# Patient Record
Sex: Female | Born: 1945 | Race: White | Hispanic: No | Marital: Married | Smoking: Current some day smoker
Health system: Southern US, Community
[De-identification: ages and names within clinical notes are randomized; demographics above are authoritative.]

## PROBLEM LIST (undated history)

## (undated) DIAGNOSIS — R4020 Unspecified coma: Secondary | ICD-10-CM

## (undated) DIAGNOSIS — K859 Acute pancreatitis without necrosis or infection, unspecified: Secondary | ICD-10-CM

## (undated) DIAGNOSIS — H02409 Unspecified ptosis of unspecified eyelid: Secondary | ICD-10-CM

## (undated) DIAGNOSIS — I499 Cardiac arrhythmia, unspecified: Secondary | ICD-10-CM

## (undated) DIAGNOSIS — K5900 Constipation, unspecified: Secondary | ICD-10-CM

## (undated) DIAGNOSIS — D649 Anemia, unspecified: Secondary | ICD-10-CM

## (undated) DIAGNOSIS — E119 Type 2 diabetes mellitus without complications: Secondary | ICD-10-CM

## (undated) DIAGNOSIS — E041 Nontoxic single thyroid nodule: Secondary | ICD-10-CM

## (undated) DIAGNOSIS — I10 Essential (primary) hypertension: Secondary | ICD-10-CM

## (undated) DIAGNOSIS — M1990 Unspecified osteoarthritis, unspecified site: Secondary | ICD-10-CM

## (undated) DIAGNOSIS — E785 Hyperlipidemia, unspecified: Secondary | ICD-10-CM

## (undated) HISTORY — DX: Essential (primary) hypertension: I10

## (undated) HISTORY — PX: CORNEAL TRANSPLANT: SHX108

## (undated) HISTORY — DX: Type 2 diabetes mellitus without complications: E11.9

## (undated) HISTORY — DX: Unspecified coma: R40.20

---
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
## 1971-07-03 HISTORY — PX: OTHER SURGICAL HISTORY: SHX169

## 1987-07-03 HISTORY — PX: BREAST IMPLANT REMOVAL: SHX5361

## 2004-07-02 HISTORY — PX: CATARACT EXTRACTION: SUR2

## 2006-12-11 DIAGNOSIS — D649 Anemia, unspecified: Secondary | ICD-10-CM | POA: Insufficient documentation

## 2006-12-12 DIAGNOSIS — M810 Age-related osteoporosis without current pathological fracture: Secondary | ICD-10-CM | POA: Insufficient documentation

## 2007-06-03 ENCOUNTER — Ambulatory Visit (HOSPITAL_COMMUNITY): Admission: RE | Admit: 2007-06-03 | Discharge: 2007-06-03 | Payer: Self-pay | Admitting: *Deleted

## 2007-06-03 HISTORY — PX: CAROTID ANGIOGRAM: SHX5765

## 2007-06-24 ENCOUNTER — Encounter: Admission: RE | Admit: 2007-06-24 | Discharge: 2007-06-24 | Payer: Self-pay | Admitting: General Surgery

## 2007-06-24 ENCOUNTER — Other Ambulatory Visit: Admission: RE | Admit: 2007-06-24 | Discharge: 2007-06-24 | Payer: Self-pay | Admitting: Interventional Radiology

## 2007-06-24 ENCOUNTER — Encounter (INDEPENDENT_AMBULATORY_CARE_PROVIDER_SITE_OTHER): Payer: Self-pay | Admitting: Interventional Radiology

## 2007-07-03 HISTORY — PX: PARS PLANA VITRECTOMY W/ REPAIR OF MACULAR HOLE: SHX2170

## 2008-06-09 ENCOUNTER — Encounter: Admission: RE | Admit: 2008-06-09 | Discharge: 2008-06-09 | Payer: Self-pay | Admitting: General Surgery

## 2008-09-30 HISTORY — PX: CARDIOVASCULAR STRESS TEST: SHX262

## 2009-07-15 ENCOUNTER — Encounter: Admission: RE | Admit: 2009-07-15 | Discharge: 2009-07-15 | Payer: Self-pay | Admitting: General Surgery

## 2010-07-28 ENCOUNTER — Encounter
Admission: RE | Admit: 2010-07-28 | Discharge: 2010-07-28 | Payer: Self-pay | Source: Home / Self Care | Attending: General Surgery | Admitting: General Surgery

## 2011-01-25 ENCOUNTER — Other Ambulatory Visit: Payer: Self-pay | Admitting: Family Medicine

## 2011-01-25 DIAGNOSIS — R42 Dizziness and giddiness: Secondary | ICD-10-CM

## 2011-01-27 ENCOUNTER — Ambulatory Visit
Admission: RE | Admit: 2011-01-27 | Discharge: 2011-01-27 | Disposition: A | Discharge status: Home / Self Care | Payer: Medicare Other | Source: Ambulatory Visit | Attending: Family Medicine | Admitting: Family Medicine

## 2011-01-27 DIAGNOSIS — R42 Dizziness and giddiness: Secondary | ICD-10-CM

## 2011-01-27 MED ORDER — GADOBENATE DIMEGLUMINE 529 MG/ML IV SOLN
15.0000 mL | Freq: Once | INTRAVENOUS | Status: AC | PRN
  Administered 2011-01-27: 15 mL via INTRAVENOUS

## 2011-04-09 LAB — PROTIME-INR
INR: 0.9
Prothrombin Time: 12.4

## 2011-04-09 LAB — BASIC METABOLIC PANEL
BUN: 17
CO2: 21
Calcium: 10
Chloride: 107
Creatinine, Ser: 0.77
GFR calc Af Amer: >60
GFR calc non Af Amer: >60
Glucose, Bld: 113 — ABNORMAL HIGH
Potassium: 4.4
Sodium: 138

## 2011-04-09 LAB — CBC
HCT: 40.4
Hemoglobin: 13.9
MCHC: 34.5
MCV: 93.2
Platelets: 278
RBC: 4.34
RDW: 13.2
WBC: 8.3

## 2011-04-09 LAB — APTT: aPTT: 30

## 2011-04-27 DIAGNOSIS — T85398A Other mechanical complication of other ocular prosthetic devices, implants and grafts, initial encounter: Secondary | ICD-10-CM | POA: Insufficient documentation

## 2012-05-09 DIAGNOSIS — H26499 Other secondary cataract, unspecified eye: Secondary | ICD-10-CM | POA: Insufficient documentation

## 2012-05-16 DIAGNOSIS — K5901 Slow transit constipation: Secondary | ICD-10-CM | POA: Insufficient documentation

## 2013-07-23 ENCOUNTER — Encounter (HOSPITAL_COMMUNITY): Payer: Self-pay | Admitting: Emergency Medicine

## 2013-07-23 ENCOUNTER — Emergency Department (HOSPITAL_COMMUNITY): Payer: Medicare Other

## 2013-07-23 ENCOUNTER — Observation Stay (HOSPITAL_COMMUNITY)
Admission: EM | Admit: 2013-07-23 | Discharge: 2013-07-25 | Disposition: A | Discharge status: Home / Self Care | Payer: Medicare Other | Attending: Internal Medicine | Admitting: Internal Medicine

## 2013-07-23 DIAGNOSIS — E785 Hyperlipidemia, unspecified: Secondary | ICD-10-CM | POA: Insufficient documentation

## 2013-07-23 DIAGNOSIS — E7800 Pure hypercholesterolemia, unspecified: Secondary | ICD-10-CM | POA: Insufficient documentation

## 2013-07-23 DIAGNOSIS — F17200 Nicotine dependence, unspecified, uncomplicated: Secondary | ICD-10-CM | POA: Insufficient documentation

## 2013-07-23 DIAGNOSIS — S32009A Unspecified fracture of unspecified lumbar vertebra, initial encounter for closed fracture: Secondary | ICD-10-CM | POA: Insufficient documentation

## 2013-07-23 DIAGNOSIS — S32019A Unspecified fracture of first lumbar vertebra, initial encounter for closed fracture: Secondary | ICD-10-CM

## 2013-07-23 DIAGNOSIS — S32010A Wedge compression fracture of first lumbar vertebra, initial encounter for closed fracture: Secondary | ICD-10-CM

## 2013-07-23 DIAGNOSIS — M5030 Other cervical disc degeneration, unspecified cervical region: Secondary | ICD-10-CM | POA: Insufficient documentation

## 2013-07-23 DIAGNOSIS — E119 Type 2 diabetes mellitus without complications: Secondary | ICD-10-CM | POA: Diagnosis present

## 2013-07-23 DIAGNOSIS — R55 Syncope and collapse: Principal | ICD-10-CM | POA: Diagnosis present

## 2013-07-23 DIAGNOSIS — R002 Palpitations: Secondary | ICD-10-CM | POA: Diagnosis present

## 2013-07-23 HISTORY — DX: Hyperlipidemia, unspecified: E78.5

## 2013-07-23 HISTORY — DX: Type 2 diabetes mellitus without complications: E11.9

## 2013-07-23 LAB — CBC WITH DIFFERENTIAL/PLATELET
Basophils Absolute: 0 10*3/uL (ref 0.0–0.1)
Basophils Relative: 0 % (ref 0–1)
Eosinophils Absolute: 0.1 10*3/uL (ref 0.0–0.7)
Eosinophils Relative: 1 % (ref 0–5)
HCT: 38.3 % (ref 36.0–46.0)
Hemoglobin: 12.8 g/dL (ref 12.0–15.0)
Lymphocytes Relative: 15 % (ref 12–46)
Lymphs Abs: 1.3 10*3/uL (ref 0.7–4.0)
MCH: 31.9 pg (ref 26.0–34.0)
MCHC: 33.4 g/dL (ref 30.0–36.0)
MCV: 95.5 fL (ref 78.0–100.0)
Monocytes Absolute: 0.8 10*3/uL (ref 0.1–1.0)
Monocytes Relative: 10 % (ref 3–12)
Neutro Abs: 6.3 10*3/uL (ref 1.7–7.7)
Neutrophils Relative %: 74 % (ref 43–77)
Platelets: 180 10*3/uL (ref 150–400)
RBC: 4.01 MIL/uL (ref 3.87–5.11)
RDW: 13 % (ref 11.5–15.5)
WBC: 8.5 10*3/uL (ref 4.0–10.5)

## 2013-07-23 LAB — COMPREHENSIVE METABOLIC PANEL
ALT: 17 U/L (ref 0–35)
AST: 29 U/L (ref 0–37)
Albumin: 4 g/dL (ref 3.5–5.2)
Alkaline Phosphatase: 75 U/L (ref 39–117)
BUN: 20 mg/dL (ref 6–23)
CO2: 22 mEq/L (ref 19–32)
Calcium: 9.3 mg/dL (ref 8.4–10.5)
Chloride: 98 mEq/L (ref 96–112)
Creatinine, Ser: 0.83 mg/dL (ref 0.50–1.10)
GFR calc Af Amer: 82 mL/min — ABNORMAL LOW (ref >90)
GFR calc non Af Amer: 71 mL/min — ABNORMAL LOW (ref >90)
Glucose, Bld: 130 mg/dL — ABNORMAL HIGH (ref 70–99)
Potassium: 4.4 mEq/L (ref 3.7–5.3)
Sodium: 135 mEq/L — ABNORMAL LOW (ref 137–147)
Total Bilirubin: 0.2 mg/dL — ABNORMAL LOW (ref 0.3–1.2)
Total Protein: 7.4 g/dL (ref 6.0–8.3)

## 2013-07-23 LAB — POCT I-STAT TROPONIN I: Troponin i, poc: 0 ng/mL (ref 0.00–0.08)

## 2013-07-23 LAB — GLUCOSE, CAPILLARY: Glucose-Capillary: 126 mg/dL — ABNORMAL HIGH (ref 70–99)

## 2013-07-23 MED ORDER — MORPHINE SULFATE 4 MG/ML IJ SOLN
4.0000 mg | Freq: Once | INTRAMUSCULAR | Status: AC
  Administered 2013-07-23: 4 mg via INTRAVENOUS
  Filled 2013-07-23: qty 1

## 2013-07-23 MED ORDER — DIAZEPAM 5 MG/ML IJ SOLN: 5.0000 mg | Freq: Once | INTRAMUSCULAR | Status: DC

## 2013-07-23 MED ORDER — SODIUM CHLORIDE 0.9 % IV BOLUS (SEPSIS)
1000.0000 mL | Freq: Once | INTRAVENOUS | Status: AC
  Administered 2013-07-23: 1000 mL via INTRAVENOUS
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
## 2013-07-23 NOTE — ED Notes (Signed)
Pt ambulated to the restroom with ease. 
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
## 2013-07-24 ENCOUNTER — Encounter (HOSPITAL_COMMUNITY): Payer: Self-pay | Admitting: *Deleted

## 2013-07-24 ENCOUNTER — Observation Stay (HOSPITAL_COMMUNITY): Payer: Medicare Other

## 2013-07-24 DIAGNOSIS — R002 Palpitations: Secondary | ICD-10-CM | POA: Diagnosis present

## 2013-07-24 DIAGNOSIS — S32009A Unspecified fracture of unspecified lumbar vertebra, initial encounter for closed fracture: Secondary | ICD-10-CM

## 2013-07-24 DIAGNOSIS — R55 Syncope and collapse: Secondary | ICD-10-CM

## 2013-07-24 DIAGNOSIS — I517 Cardiomegaly: Secondary | ICD-10-CM

## 2013-07-24 DIAGNOSIS — E119 Type 2 diabetes mellitus without complications: Secondary | ICD-10-CM

## 2013-07-24 DIAGNOSIS — S32010A Wedge compression fracture of first lumbar vertebra, initial encounter for closed fracture: Secondary | ICD-10-CM | POA: Diagnosis present

## 2013-07-24 LAB — CBC
HCT: 34.1 % — ABNORMAL LOW (ref 36.0–46.0)
Hemoglobin: 11.6 g/dL — ABNORMAL LOW (ref 12.0–15.0)
MCH: 32.5 pg (ref 26.0–34.0)
MCHC: 34 g/dL (ref 30.0–36.0)
MCV: 95.5 fL (ref 78.0–100.0)
Platelets: 185 10*3/uL (ref 150–400)
RBC: 3.57 MIL/uL — ABNORMAL LOW (ref 3.87–5.11)
RDW: 12.9 % (ref 11.5–15.5)
WBC: 7.6 10*3/uL (ref 4.0–10.5)

## 2013-07-24 LAB — COMPREHENSIVE METABOLIC PANEL
ALT: 15 U/L (ref 0–35)
AST: 26 U/L (ref 0–37)
Albumin: 3.3 g/dL — ABNORMAL LOW (ref 3.5–5.2)
Alkaline Phosphatase: 67 U/L (ref 39–117)
BUN: 16 mg/dL (ref 6–23)
CO2: 21 mEq/L (ref 19–32)
Calcium: 8.9 mg/dL (ref 8.4–10.5)
Chloride: 99 mEq/L (ref 96–112)
Creatinine, Ser: 0.66 mg/dL (ref 0.50–1.10)
GFR calc Af Amer: >90 mL/min (ref >90)
GFR calc non Af Amer: 89 mL/min — ABNORMAL LOW (ref >90)
Glucose, Bld: 166 mg/dL — ABNORMAL HIGH (ref 70–99)
Potassium: 4.4 mEq/L (ref 3.7–5.3)
Sodium: 136 mEq/L — ABNORMAL LOW (ref 137–147)
Total Bilirubin: <0.2 mg/dL — ABNORMAL LOW (ref 0.3–1.2)
Total Protein: 6.6 g/dL (ref 6.0–8.3)

## 2013-07-24 LAB — GLUCOSE, CAPILLARY
Glucose-Capillary: 112 mg/dL — ABNORMAL HIGH (ref 70–99)
Glucose-Capillary: 137 mg/dL — ABNORMAL HIGH (ref 70–99)
Glucose-Capillary: 122 mg/dL — ABNORMAL HIGH (ref 70–99)

## 2013-07-24 LAB — PROTIME-INR
INR: 0.98 (ref 0.00–1.49)
Prothrombin Time: 12.8 seconds (ref 11.6–15.2)

## 2013-07-24 LAB — TROPONIN I
Troponin I: <0.3 ng/mL (ref <0.30)
Troponin I: <0.3 ng/mL (ref <0.30)
Troponin I: <0.3 ng/mL (ref <0.30)

## 2013-07-24 LAB — HEMOGLOBIN A1C
Hgb A1c MFr Bld: 6.1 % — ABNORMAL HIGH (ref <5.7)
Mean Plasma Glucose: 128 mg/dL — ABNORMAL HIGH (ref <117)

## 2013-07-24 MED ORDER — ENOXAPARIN SODIUM 40 MG/0.4ML ~~LOC~~ SOLN
40.0000 mg | SUBCUTANEOUS | Status: DC
  Administered 2013-07-24: 40 mg via SUBCUTANEOUS
  Filled 2013-07-24 (×2): qty 0.4

## 2013-07-24 MED ORDER — CALCIUM-VITAMIN D 600-125 MG-UNIT PO TABS: 1.0000 | ORAL_TABLET | Freq: Every day | ORAL | Status: DC

## 2013-07-24 MED ORDER — INSULIN ASPART 100 UNIT/ML ~~LOC~~ SOLN: 0.0000 [IU] | Freq: Three times a day (TID) | SUBCUTANEOUS | Status: DC

## 2013-07-24 MED ORDER — ACETAMINOPHEN 650 MG RE SUPP: 650.0000 mg | Freq: Four times a day (QID) | RECTAL | Status: DC | PRN

## 2013-07-24 MED ORDER — CYCLOBENZAPRINE HCL 5 MG PO TABS
7.5000 mg | ORAL_TABLET | Freq: Three times a day (TID) | ORAL | Status: DC | PRN
  Filled 2013-07-24: qty 1.5

## 2013-07-24 MED ORDER — CALCIUM CARBONATE-VITAMIN D 500-200 MG-UNIT PO TABS
1.0000 | ORAL_TABLET | Freq: Every day | ORAL | Status: DC
  Administered 2013-07-24 – 2013-07-25 (×2): 1 via ORAL
  Filled 2013-07-24 (×3): qty 1

## 2013-07-24 MED ORDER — ONDANSETRON HCL 4 MG/2ML IJ SOLN
4.0000 mg | Freq: Four times a day (QID) | INTRAMUSCULAR | Status: DC | PRN
  Administered 2013-07-24: 4 mg via INTRAVENOUS
  Filled 2013-07-24: qty 2

## 2013-07-24 MED ORDER — HYDROCODONE-ACETAMINOPHEN 5-325 MG PO TABS
1.0000 | ORAL_TABLET | ORAL | Status: DC | PRN
  Administered 2013-07-24 – 2013-07-25 (×4): 2 via ORAL
  Filled 2013-07-24 (×4): qty 2

## 2013-07-24 MED ORDER — DOCUSATE SODIUM 100 MG PO CAPS
200.0000 mg | ORAL_CAPSULE | Freq: Two times a day (BID) | ORAL | Status: DC
  Administered 2013-07-24 (×2): 200 mg via ORAL
  Filled 2013-07-24 (×4): qty 2

## 2013-07-24 MED ORDER — SODIUM CHLORIDE 0.9 % IV SOLN
INTRAVENOUS | Status: AC
  Administered 2013-07-24: 05:00:00 via INTRAVENOUS

## 2013-07-24 MED ORDER — ACETAMINOPHEN 325 MG PO TABS: 650.0000 mg | ORAL_TABLET | Freq: Four times a day (QID) | ORAL | Status: DC | PRN

## 2013-07-24 MED ORDER — METOPROLOL TARTRATE 25 MG PO TABS
25.0000 mg | ORAL_TABLET | Freq: Two times a day (BID) | ORAL | Status: DC
  Administered 2013-07-24: 25 mg via ORAL
  Filled 2013-07-24 (×4): qty 1

## 2013-07-24 MED ORDER — SODIUM CHLORIDE 0.9 % IJ SOLN
3.0000 mL | Freq: Two times a day (BID) | INTRAMUSCULAR | Status: DC
  Administered 2013-07-24: 05:00:00 via INTRAVENOUS
  Administered 2013-07-24: 3 mL via INTRAVENOUS

## 2013-07-24 MED ORDER — ONDANSETRON HCL 4 MG PO TABS: 4.0000 mg | ORAL_TABLET | Freq: Four times a day (QID) | ORAL | Status: DC | PRN

## 2013-07-24 MED ORDER — METFORMIN HCL ER 500 MG PO TB24
500.0000 mg | ORAL_TABLET | Freq: Every day | ORAL | Status: DC
  Administered 2013-07-24 – 2013-07-25 (×2): 500 mg via ORAL
  Filled 2013-07-24 (×3): qty 1

## 2013-07-24 MED ORDER — GUAIFENESIN ER 600 MG PO TB12
600.0000 mg | ORAL_TABLET | Freq: Two times a day (BID) | ORAL | Status: DC
  Administered 2013-07-24 (×2): 600 mg via ORAL
  Filled 2013-07-24 (×4): qty 1

## 2013-07-24 NOTE — Progress Notes (Signed)
EEG completed; results pending.    
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
## 2013-07-24 NOTE — Procedures (Signed)
History: 68 year old female with a history of syncope  Sedation: None  Background: The background consists of alpha and beta activities. There is a well defined posterior dominant rhythm of 8.5 Hz that attenuates with eye opening. There is mild anterior shifting of the PDR with drowsiness. No sleep structures were observed  Photic stimulation: Physiologic driving is present  EEG Abnormalities: 1) none  Clinical Interpretation: This normal EEG is recorded in the waking and sleep state. There was no seizure or seizure predisposition recorded on this study.   Ritta SlotMcNeill Kirkpatrick, MD [facility] [phone]  If 7pm- 7am, please page neurology on call at [phone].

## 2013-07-24 NOTE — Progress Notes (Signed)
Bilateral carotid artery duplex:  1-39% ICA stenosis.  Vertebral artery flow is antegrade.     

## 2013-07-24 NOTE — Progress Notes (Signed)
Pt examined and chart reviewed. [facility]. Pain is significantly improved after receiving norco. She is awake, alert, and oriented and appears comfortable. She will be monitored on telemetry to check for arrhythmia that may have lead to her possible syncope episode. Pt is a diabetic on metformin, will order BID CBGs. All of her questions were addressed.
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
## 2013-07-24 NOTE — Progress Notes (Signed)
  Echocardiogram 2D Echocardiogram has been performed.  Jorje GuildCHUNG, KWONG 07/24/2013, 12:10 PM

## 2013-07-24 NOTE — Progress Notes (Signed)
UR completed 

## 2013-07-25 DIAGNOSIS — R002 Palpitations: Secondary | ICD-10-CM

## 2013-07-25 LAB — GLUCOSE, CAPILLARY
Glucose-Capillary: 130 mg/dL — ABNORMAL HIGH (ref 70–99)
Glucose-Capillary: 131 mg/dL — ABNORMAL HIGH (ref 70–99)

## 2013-07-25 MED ORDER — IBUPROFEN 400 MG PO TABS: 200.0000 mg | ORAL_TABLET | Freq: Four times a day (QID) | ORAL | Status: DC | PRN

## 2013-07-25 MED ORDER — HYDROCODONE-ACETAMINOPHEN 5-325 MG PO TABS: 1.0000 | ORAL_TABLET | Freq: Four times a day (QID) | ORAL | Status: DC | PRN
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
## 2013-07-25 NOTE — Progress Notes (Signed)
Went over discharge instructions with patient and husband. Again stressed importance of patient not to drive until cleared by MD. No additional questions or concerns related to discharge. IV discontinued, and patient taken off cardiac monitor. Patient discharged home with husband. Stanton KidneyCURRY, TEAL R
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
## 2013-07-25 NOTE — Discharge Instructions (Signed)
Do not drive and provide baby sitting services until you have seen by the cardiologist and the  Neurologist and advised to do so again.  Where the lumbar brace given to you while you were ambulating and standing.   Follow with Primary MD SPEAR, TAMMY, MD in 7 days   Get CBC, CMP, checked 7 days by Primary MD and again as instructed by your Primary MD.    Activity: As tolerated with Full fall precautions use walker/cane & assistance as needed   Disposition Home     Diet:  Heart Healthy - Low carb  For Heart failure patients - Check your Weight same time everyday, if you gain over 2 pounds, or you develop in leg swelling, experience more shortness of breath or chest pain, call your Primary MD immediately. Follow Cardiac Low Salt Diet and 1.8 lit/day fluid restriction.   On your next visit with her primary care physician please Get Medicines reviewed and adjusted.  Please request your Prim.MD to go over all Hospital Tests and Procedure/Radiological results at the follow up, please get all Hospital records sent to your Prim MD by signing hospital release before you go home.   If you experience worsening of your admission symptoms, develop shortness of breath, life threatening emergency, suicidal or homicidal thoughts you must seek medical attention immediately by calling 911 or calling your MD immediately  if symptoms less severe.  You Must read complete instructions/literature along with all the possible adverse reactions/side effects for all the Medicines you take and that have been prescribed to you. Take any new Medicines after you have completely understood and accpet all the possible adverse reactions/side effects.   Do not drive when taking Pain medications.    Do not take more than prescribed Pain, Sleep and Anxiety Medications  Special Instructions: If you have smoked or chewed Tobacco  in the last 2 yrs please stop smoking, stop any regular Alcohol  and or any Recreational  drug use.  Wear Seat belts while driving.   Please note  You were cared for by a hospitalist during your hospital stay. If you have any questions about your discharge medications or the care you received while you were in the hospital after you are discharged, you can call the unit and asked to speak with the hospitalist on call if the hospitalist that took care of you is not available. Once you are discharged, your primary care physician will handle any further medical issues. Please note that NO REFILLS for any discharge medications will be authorized once you are discharged, as it is imperative that you return to your primary care physician (or establish a relationship with a primary care physician if you do not have one) for your aftercare needs so that they can reassess your need for medications and monitor your lab values.

## 2013-07-25 NOTE — Progress Notes (Signed)
Patient has strict orders from MD not to operate a vehicle for 1 month. I explained the order to the patient and the reasoning behind it. Patient confirmed understanding, and agreed to comply with this order. Patient stated that her Husband will be her primary source of transportation. Stanton KidneyCURRY, TEAL R

## 2013-07-29 ENCOUNTER — Ambulatory Visit (INDEPENDENT_AMBULATORY_CARE_PROVIDER_SITE_OTHER): Payer: Medicare Other | Admitting: Diagnostic Neuroimaging

## 2013-07-29 ENCOUNTER — Encounter: Payer: Self-pay | Admitting: Diagnostic Neuroimaging

## 2013-07-29 ENCOUNTER — Ambulatory Visit: Payer: Medicare Other | Admitting: *Deleted

## 2013-07-29 VITALS — BP 121/72 | HR 68 | Ht 64.0 in | Wt 161.0 lb

## 2013-07-29 DIAGNOSIS — R55 Syncope and collapse: Secondary | ICD-10-CM

## 2013-07-29 DIAGNOSIS — H02409 Unspecified ptosis of unspecified eyelid: Secondary | ICD-10-CM

## 2013-07-29 DIAGNOSIS — H02401 Unspecified ptosis of right eyelid: Secondary | ICD-10-CM | POA: Insufficient documentation
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
## 2013-07-30 DIAGNOSIS — R55 Syncope and collapse: Secondary | ICD-10-CM

## 2013-08-10 ENCOUNTER — Ambulatory Visit
Admission: RE | Admit: 2013-08-10 | Discharge: 2013-08-10 | Disposition: A | Discharge status: Home / Self Care | Payer: Medicare Other | Source: Ambulatory Visit | Attending: Diagnostic Neuroimaging | Admitting: Diagnostic Neuroimaging

## 2013-08-10 DIAGNOSIS — H02409 Unspecified ptosis of unspecified eyelid: Secondary | ICD-10-CM

## 2013-08-10 DIAGNOSIS — R55 Syncope and collapse: Secondary | ICD-10-CM

## 2013-08-10 DIAGNOSIS — H02401 Unspecified ptosis of right eyelid: Secondary | ICD-10-CM

## 2013-08-13 ENCOUNTER — Telehealth: Payer: Self-pay | Admitting: Diagnostic Neuroimaging

## 2013-08-14 NOTE — Telephone Encounter (Signed)
Calling again regarding MRI results and also needs note to go back to work Monday

## 2013-08-14 NOTE — Telephone Encounter (Signed)
I spoke with pt and gave her the MRI results.   I relayed to her after speaking with Dr. Marjory LiesPenumalli again that he has cleared her from seizure standpoint.  He feels that pcp or cardiology should give her clearance for work since she is still being worked up from their standpoint.  She verbalized understanding.

## 2013-08-14 NOTE — Telephone Encounter (Signed)
I spoke to pt and she is feeling ok.  She has cardiac monitor on for 2 wks now.  Has appt with CD on 09-08-13.  Husband is driving her.  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  Please advise and I will call cell # back.

## 2013-08-31 ENCOUNTER — Encounter: Payer: Self-pay | Admitting: Cardiovascular Disease

## 2013-08-31 ENCOUNTER — Ambulatory Visit (INDEPENDENT_AMBULATORY_CARE_PROVIDER_SITE_OTHER): Payer: Medicare Other | Admitting: Cardiovascular Disease

## 2013-08-31 VITALS — BP 128/68 | HR 69 | Ht 64.0 in | Wt 156.0 lb

## 2013-08-31 DIAGNOSIS — R002 Palpitations: Secondary | ICD-10-CM

## 2013-08-31 DIAGNOSIS — E785 Hyperlipidemia, unspecified: Secondary | ICD-10-CM | POA: Insufficient documentation

## 2013-08-31 DIAGNOSIS — R55 Syncope and collapse: Secondary | ICD-10-CM

## 2013-08-31 MED ORDER — METOPROLOL TARTRATE 25 MG PO TABS: 25.0000 mg | ORAL_TABLET | Freq: Two times a day (BID) | ORAL | Status: DC
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
## 2013-08-31 NOTE — Assessment & Plan Note (Signed)
Statin intolerant, on red yeast rice followed by her PCP

## 2013-08-31 NOTE — Patient Instructions (Signed)
Your physician wants you to follow-up in: 1 year with Dr Berry. You will receive a reminder letter in the mail two months in advance. If you don't receive a letter, please call our office to schedule the follow-up appointment.  

## 2013-09-08 ENCOUNTER — Ambulatory Visit: Payer: Medicare Other | Admitting: Cardiovascular Disease

## 2014-06-02 DIAGNOSIS — Z947 Corneal transplant status: Secondary | ICD-10-CM | POA: Insufficient documentation

## 2014-06-22 ENCOUNTER — Encounter: Payer: Self-pay | Admitting: Cardiovascular Disease

## 2014-09-03 ENCOUNTER — Encounter: Payer: Self-pay | Admitting: Cardiovascular Disease

## 2014-09-03 ENCOUNTER — Ambulatory Visit (INDEPENDENT_AMBULATORY_CARE_PROVIDER_SITE_OTHER): Payer: Medicare Other | Admitting: Cardiovascular Disease

## 2014-09-03 VITALS — BP 122/60 | HR 64 | Ht 64.0 in | Wt 156.3 lb

## 2014-09-03 DIAGNOSIS — R002 Palpitations: Secondary | ICD-10-CM

## 2014-09-03 DIAGNOSIS — E785 Hyperlipidemia, unspecified: Secondary | ICD-10-CM

## 2014-09-03 NOTE — Assessment & Plan Note (Signed)
History of hyperlipidemia with statin intolerance. She is on red yeast rice and low-dose atorvastatin followed by her PCP
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
## 2014-09-03 NOTE — Patient Instructions (Signed)
Dr.Berry wants you to follow-up in: ONE YEAR. You will receive a reminder letter in the mail two months in advance. If you don't receive a letter, please call our office to schedule the follow-up appointment.  

## 2014-10-21 ENCOUNTER — Telehealth: Payer: Self-pay | Admitting: *Deleted

## 2014-10-21 NOTE — Telephone Encounter (Signed)
Patient wrote Dr Allyson SabalBerry a letter concerned about her muscle discomfort coming from her statin possibly.  Dr Allyson SabalBerry reviewed the letter and suggested that she see Belenda CruiseKristin in the lipid clinic.   Patient contacted and appt made.

## 2014-10-22 ENCOUNTER — Telehealth: Payer: Self-pay | Admitting: Cardiovascular Disease

## 2014-10-22 ENCOUNTER — Ambulatory Visit (INDEPENDENT_AMBULATORY_CARE_PROVIDER_SITE_OTHER): Payer: Medicare Other | Admitting: Pharmacist Clinician (PhC)/ Clinical Pharmacy Specialist

## 2014-10-22 VITALS — Ht 64.0 in | Wt 156.0 lb

## 2014-10-22 DIAGNOSIS — E785 Hyperlipidemia, unspecified: Secondary | ICD-10-CM | POA: Diagnosis not present

## 2014-10-22 NOTE — Telephone Encounter (Signed)
°  1. Which medications need to be refilled? Lov[facility]  2. Which pharmacy is medication to be sent to?[facility]   3. Do they need a 30 day or 90 day supply? 30  4. Would they like a call back once the medication has been sent to the pharmacy? yes

## 2014-10-22 NOTE — Telephone Encounter (Signed)
Pt presented to pharmacy to get fish oil. Rx for fish oil submitted over the phone per our pharmacist recommendations (see Kristin's note from today) to pharmacy.

## 2014-10-22 NOTE — Patient Instructions (Signed)
Stop atorvastatin.   Wait 2-3 weeks then start the Crestor 5 mg once weekly.  If you can tolerate this after 4 weeks then increase to 1 tablet twice weekly.   Call me when you run out and we'll try to get more samples.  Start fish oil, 2 capsules twice daily.  Can keep in refrigerator to avoid fishy taste.  Will repeat labs in 8-10 weeks.

## 2014-10-24 ENCOUNTER — Encounter: Payer: Self-pay | Admitting: Pharmacist Clinician (PhC)/ Clinical Pharmacy Specialist

## 2014-10-24 MED ORDER — ROSUVASTATIN CALCIUM 5 MG PO TABS: ORAL_TABLET | ORAL | Status: DC
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
## 2014-10-24 NOTE — Assessment & Plan Note (Addendum)
Patient will not qualify for PCSK-9 inhibitor due to lack of CAD at this time.  Patient understands this.  For now I have asked her to start with fish oil 2 gm bid to help lower her triglycerides.  She is also to stop the atorvastatin and red yeast rice.  After 2-3 weeks, as her muscle aches start to go away, she can re-challenge with Crestor 5 mg once weekly.  I told her that if she can tolerate this with little/no muscle problems we can then increase the dose to twice weekly.  She is to call in 4-6 weeks and let us know how she is doing.  Will repeat labs after she has been on the Crestor for 8-10 weeks.

## 2014-11-04 ENCOUNTER — Encounter: Payer: Self-pay | Admitting: Cardiovascular Disease

## 2014-11-05 ENCOUNTER — Encounter: Payer: Self-pay | Admitting: Cardiovascular Disease

## 2014-12-02 ENCOUNTER — Telehealth: Payer: Self-pay | Admitting: Pharmacist Clinician (PhC)/ Clinical Pharmacy Specialist

## 2014-12-02 NOTE — Telephone Encounter (Signed)
Pt called, LMOM, stating has been on Crestor 5 mg qweek without problem since April visit.  Recently tried to increase to 10 mg qweek, but developed muscle aches and pain in joints.    Returned call, spoke with husband, advised to stay with 5 mg but try twice weekly.  If she cannot tolerate this then go back to 5 mg qweek, and let us know.

## 2015-01-10 ENCOUNTER — Telehealth: Payer: Self-pay | Admitting: Pharmacist Clinician (PhC)/ Clinical Pharmacy Specialist

## 2015-01-10 DIAGNOSIS — E785 Hyperlipidemia, unspecified: Secondary | ICD-10-CM

## 2015-01-10 NOTE — Telephone Encounter (Signed)
Spoke with patient, has been taking Crestor 10 mg twice weekly since April and has recently developed some minor muscle aches/pains.    Will have patient get lipid labs drawn this week and depending on results, may be able to cut back dose to 5 mg 2-3 times each week.  Pt hesitant to decrease dose, but also afraid of pains becoming worse.  Agreed with plan to check labs (will try to do tomorrow) and wait for results before making changes.

## 2015-01-11 LAB — HEPATIC FUNCTION PANEL
ALT: 12 U/L (ref 0–35)
AST: 19 U/L (ref 0–37)
Albumin: 4.4 g/dL (ref 3.5–5.2)
Alkaline Phosphatase: 53 U/L (ref 39–117)
Bilirubin, Direct: 0.1 mg/dL (ref 0.0–0.3)
Indirect Bilirubin: 0.3 mg/dL (ref 0.2–1.2)
Total Bilirubin: 0.4 mg/dL (ref 0.2–1.2)
Total Protein: 6.8 g/dL (ref 6.0–8.3)

## 2015-01-11 LAB — LIPID PANEL
Cholesterol: 206 mg/dL — ABNORMAL HIGH (ref 0–200)
HDL: 59 mg/dL (ref >=46)
LDL Cholesterol: 114 mg/dL — ABNORMAL HIGH (ref 0–99)
Total CHOL/HDL Ratio: 3.5 Ratio
Triglycerides: 164 mg/dL — ABNORMAL HIGH (ref <150)
VLDL: 33 mg/dL (ref 0–40)

## 2015-01-14 ENCOUNTER — Encounter: Payer: Self-pay | Admitting: *Deleted

## 2015-01-17 ENCOUNTER — Telehealth: Payer: Self-pay | Admitting: Pharmacist Clinician (PhC)/ Clinical Pharmacy Specialist

## 2015-01-17 MED ORDER — ROSUVASTATIN CALCIUM 5 MG PO TABS: ORAL_TABLET | ORAL | Status: DC

## 2015-01-17 NOTE — Telephone Encounter (Signed)
Reviewed labs with patient.  LDL better at 114, on Crestor 10 mg twice weekly.  Pt states will probably stay at this dose, but may try 5 mg 3-4 times per week to see if any change.  Will call prescription into [facility] - [place]

## 2015-02-18 DIAGNOSIS — Z8639 Personal history of other endocrine, nutritional and metabolic disease: Secondary | ICD-10-CM | POA: Insufficient documentation

## 2015-08-10 DIAGNOSIS — H26493 Other secondary cataract, bilateral: Secondary | ICD-10-CM | POA: Insufficient documentation

## 2015-08-10 DIAGNOSIS — Z961 Presence of intraocular lens: Secondary | ICD-10-CM | POA: Insufficient documentation

## 2015-08-10 DIAGNOSIS — H43813 Vitreous degeneration, bilateral: Secondary | ICD-10-CM | POA: Insufficient documentation

## 2015-09-07 ENCOUNTER — Encounter: Payer: Self-pay | Admitting: Cardiovascular Disease

## 2015-09-07 ENCOUNTER — Ambulatory Visit (INDEPENDENT_AMBULATORY_CARE_PROVIDER_SITE_OTHER): Payer: Medicare Other | Admitting: Cardiovascular Disease

## 2015-09-07 VITALS — BP 132/78 | HR 61 | Ht 64.0 in | Wt 156.0 lb

## 2015-09-07 DIAGNOSIS — E785 Hyperlipidemia, unspecified: Secondary | ICD-10-CM

## 2015-09-07 DIAGNOSIS — R002 Palpitations: Secondary | ICD-10-CM
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
## 2015-09-07 NOTE — Patient Instructions (Signed)

## 2015-09-07 NOTE — Assessment & Plan Note (Signed)
History of hyperlipidemia statin intolerant on 5 mg of Crestor 3 times a week. Her most recent lipid profile performed by her PCP 07/27/15 revealed total cholesterol 229, LDL 129 and HDL of 69.
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
## 2016-07-18 ENCOUNTER — Emergency Department (HOSPITAL_COMMUNITY): Payer: Medicare Other

## 2016-07-18 ENCOUNTER — Inpatient Hospital Stay (HOSPITAL_COMMUNITY): Payer: Medicare Other

## 2016-07-18 ENCOUNTER — Inpatient Hospital Stay (HOSPITAL_COMMUNITY)
Admission: EM | Admit: 2016-07-18 | Discharge: 2016-07-22 | DRG: 439 | Disposition: A | Discharge status: Home / Self Care | Payer: Medicare Other | Attending: Internal Medicine | Admitting: Internal Medicine

## 2016-07-18 ENCOUNTER — Encounter (HOSPITAL_COMMUNITY): Payer: Self-pay | Admitting: Emergency Medicine

## 2016-07-18 DIAGNOSIS — E119 Type 2 diabetes mellitus without complications: Secondary | ICD-10-CM

## 2016-07-18 DIAGNOSIS — Z880 Allergy status to penicillin: Secondary | ICD-10-CM | POA: Diagnosis not present

## 2016-07-18 DIAGNOSIS — D72829 Elevated white blood cell count, unspecified: Secondary | ICD-10-CM

## 2016-07-18 DIAGNOSIS — E785 Hyperlipidemia, unspecified: Secondary | ICD-10-CM | POA: Diagnosis present

## 2016-07-18 DIAGNOSIS — I10 Essential (primary) hypertension: Secondary | ICD-10-CM

## 2016-07-18 DIAGNOSIS — K859 Acute pancreatitis without necrosis or infection, unspecified: Principal | ICD-10-CM | POA: Diagnosis present

## 2016-07-18 DIAGNOSIS — Z7984 Long term (current) use of oral hypoglycemic drugs: Secondary | ICD-10-CM | POA: Diagnosis not present

## 2016-07-18 DIAGNOSIS — K5900 Constipation, unspecified: Secondary | ICD-10-CM | POA: Diagnosis present

## 2016-07-18 DIAGNOSIS — K8500 Idiopathic acute pancreatitis without necrosis or infection: Secondary | ICD-10-CM | POA: Diagnosis not present

## 2016-07-18 DIAGNOSIS — E860 Dehydration: Secondary | ICD-10-CM | POA: Diagnosis present

## 2016-07-18 DIAGNOSIS — Z9841 Cataract extraction status, right eye: Secondary | ICD-10-CM

## 2016-07-18 DIAGNOSIS — J9811 Atelectasis: Secondary | ICD-10-CM | POA: Diagnosis present

## 2016-07-18 DIAGNOSIS — F17200 Nicotine dependence, unspecified, uncomplicated: Secondary | ICD-10-CM | POA: Diagnosis present

## 2016-07-18 DIAGNOSIS — Z7982 Long term (current) use of aspirin: Secondary | ICD-10-CM

## 2016-07-18 DIAGNOSIS — R1013 Epigastric pain: Secondary | ICD-10-CM | POA: Diagnosis present

## 2016-07-18 DIAGNOSIS — Z79899 Other long term (current) drug therapy: Secondary | ICD-10-CM | POA: Diagnosis not present

## 2016-07-18 DIAGNOSIS — D72823 Leukemoid reaction: Secondary | ICD-10-CM | POA: Diagnosis not present

## 2016-07-18 DIAGNOSIS — E784 Other hyperlipidemia: Secondary | ICD-10-CM | POA: Diagnosis not present

## 2016-07-18 HISTORY — DX: Unspecified ptosis of unspecified eyelid: H02.409

## 2016-07-18 HISTORY — DX: Anemia, unspecified: D64.9

## 2016-07-18 HISTORY — DX: Constipation, unspecified: K59.00

## 2016-07-18 LAB — COMPREHENSIVE METABOLIC PANEL
ALT: 16 U/L (ref 14–54)
AST: 24 U/L (ref 15–41)
Albumin: 4.4 g/dL (ref 3.5–5.0)
Alkaline Phosphatase: 48 U/L (ref 38–126)
Anion gap: 8 (ref 5–15)
BUN: 26 mg/dL — ABNORMAL HIGH (ref 6–20)
CO2: 27 mmol/L (ref 22–32)
Calcium: 9.8 mg/dL (ref 8.9–10.3)
Chloride: 105 mmol/L (ref 101–111)
Creatinine, Ser: 0.9 mg/dL (ref 0.44–1.00)
GFR calc Af Amer: >60 mL/min (ref >60)
GFR calc non Af Amer: >60 mL/min (ref >60)
Glucose, Bld: 158 mg/dL — ABNORMAL HIGH (ref 65–99)
Potassium: 4.3 mmol/L (ref 3.5–5.1)
Sodium: 140 mmol/L (ref 135–145)
Total Bilirubin: 0.6 mg/dL (ref 0.3–1.2)
Total Protein: 7.5 g/dL (ref 6.5–8.1)

## 2016-07-18 LAB — CREATININE, SERUM
Creatinine, Ser: 0.99 mg/dL (ref 0.44–1.00)
GFR calc Af Amer: >60 mL/min (ref >60)
GFR calc non Af Amer: 56 mL/min — ABNORMAL LOW (ref >60)

## 2016-07-18 LAB — CBC
HCT: 39.2 % (ref 36.0–46.0)
HCT: 38.6 % (ref 36.0–46.0)
Hemoglobin: 13.4 g/dL (ref 12.0–15.0)
Hemoglobin: 13.1 g/dL (ref 12.0–15.0)
MCH: 32.9 pg (ref 26.0–34.0)
MCH: 32.8 pg (ref 26.0–34.0)
MCHC: 34.2 g/dL (ref 30.0–36.0)
MCHC: 33.9 g/dL (ref 30.0–36.0)
MCV: 96.3 fL (ref 78.0–100.0)
MCV: 96.7 fL (ref 78.0–100.0)
Platelets: 250 10*3/uL (ref 150–400)
Platelets: 214 10*3/uL (ref 150–400)
RBC: 4.07 MIL/uL (ref 3.87–5.11)
RBC: 3.99 MIL/uL (ref 3.87–5.11)
RDW: 13 % (ref 11.5–15.5)
RDW: 13.1 % (ref 11.5–15.5)
WBC: 14.5 10*3/uL — ABNORMAL HIGH (ref 4.0–10.5)
WBC: 15.9 10*3/uL — ABNORMAL HIGH (ref 4.0–10.5)

## 2016-07-18 LAB — URINALYSIS, ROUTINE W REFLEX MICROSCOPIC
Bacteria, UA: NONE SEEN
Bilirubin Urine: NEGATIVE
Glucose, UA: NEGATIVE mg/dL
Ketones, ur: NEGATIVE mg/dL
Nitrite: NEGATIVE
Protein, ur: NEGATIVE mg/dL
Specific Gravity, Urine: 1.021 (ref 1.005–1.030)
pH: 5 (ref 5.0–8.0)

## 2016-07-18 LAB — LIPASE, BLOOD: Lipase: 2139 U/L — ABNORMAL HIGH (ref 11–51)

## 2016-07-18 MED ORDER — ACETAMINOPHEN 325 MG PO TABS
650.0000 mg | ORAL_TABLET | Freq: Four times a day (QID) | ORAL | Status: DC | PRN
Start: 2016-07-18 — End: 2016-07-22
  Filled 2016-07-18: qty 2

## 2016-07-18 MED ORDER — CALCIUM CARBONATE-VITAMIN D 500-200 MG-UNIT PO TABS
1.0000 | ORAL_TABLET | Freq: Every day | ORAL | Status: DC
  Administered 2016-07-19 – 2016-07-21 (×3): 1 via ORAL
  Filled 2016-07-18 (×3): qty 1

## 2016-07-18 MED ORDER — ASPIRIN EC 81 MG PO TBEC
81.0000 mg | DELAYED_RELEASE_TABLET | ORAL | Status: DC
  Administered 2016-07-20: 81 mg via ORAL
  Filled 2016-07-18: qty 1

## 2016-07-18 MED ORDER — DEXTROSE-NACL 5-0.45 % IV SOLN
INTRAVENOUS | Status: DC
  Administered 2016-07-18 – 2016-07-20 (×4): via INTRAVENOUS

## 2016-07-18 MED ORDER — MORPHINE SULFATE (PF) 2 MG/ML IV SOLN
1.0000 mg | INTRAVENOUS | Status: DC | PRN
  Administered 2016-07-18 – 2016-07-20 (×3): 1 mg via INTRAVENOUS
  Filled 2016-07-18 (×3): qty 1

## 2016-07-18 MED ORDER — ROSUVASTATIN CALCIUM 5 MG PO TABS
5.0000 mg | ORAL_TABLET | ORAL | Status: DC
  Administered 2016-07-20: 5 mg via ORAL
  Filled 2016-07-18 (×2): qty 1

## 2016-07-18 MED ORDER — VITAMIN B-12 1000 MCG PO TABS
1000.0000 ug | ORAL_TABLET | Freq: Every day | ORAL | Status: DC
  Administered 2016-07-19 – 2016-07-21 (×3): 1000 ug via ORAL
  Filled 2016-07-18 (×3): qty 1

## 2016-07-18 MED ORDER — METOPROLOL SUCCINATE ER 25 MG PO TB24
25.0000 mg | ORAL_TABLET | Freq: Every day | ORAL | Status: DC
  Administered 2016-07-18 – 2016-07-21 (×4): 25 mg via ORAL
  Filled 2016-07-18 (×4): qty 1

## 2016-07-18 MED ORDER — ONDANSETRON HCL 4 MG/2ML IJ SOLN
4.0000 mg | Freq: Once | INTRAMUSCULAR | Status: AC
  Administered 2016-07-18: 4 mg via INTRAVENOUS
  Filled 2016-07-18: qty 2

## 2016-07-18 MED ORDER — ENOXAPARIN SODIUM 40 MG/0.4ML ~~LOC~~ SOLN
40.0000 mg | SUBCUTANEOUS | Status: DC
  Administered 2016-07-18 – 2016-07-21 (×4): 40 mg via SUBCUTANEOUS
  Filled 2016-07-18 (×4): qty 0.4

## 2016-07-18 MED ORDER — PREDNISOLONE ACETATE 1 % OP SUSP
1.0000 [drp] | Freq: Every day | OPHTHALMIC | Status: DC
  Administered 2016-07-18 – 2016-07-22 (×5): 1 [drp] via OPHTHALMIC
  Filled 2016-07-18: qty 1

## 2016-07-18 MED ORDER — ONDANSETRON HCL 4 MG PO TABS: 4.0000 mg | ORAL_TABLET | Freq: Four times a day (QID) | ORAL | Status: DC | PRN

## 2016-07-18 MED ORDER — VITAMIN D3 25 MCG (1000 UNIT) PO TABS
2000.0000 [IU] | ORAL_TABLET | Freq: Every day | ORAL | Status: DC
  Administered 2016-07-19 – 2016-07-21 (×3): 2000 [IU] via ORAL
  Filled 2016-07-18 (×3): qty 2

## 2016-07-18 MED ORDER — MORPHINE SULFATE (PF) 4 MG/ML IV SOLN
4.0000 mg | Freq: Once | INTRAVENOUS | Status: AC
  Administered 2016-07-18: 4 mg via INTRAVENOUS
  Filled 2016-07-18: qty 1

## 2016-07-18 MED ORDER — ACETAMINOPHEN 650 MG RE SUPP: 650.0000 mg | Freq: Four times a day (QID) | RECTAL | Status: DC | PRN

## 2016-07-18 MED ORDER — IOPAMIDOL (ISOVUE-300) INJECTION 61%
100.0000 mL | Freq: Once | INTRAVENOUS | Status: AC | PRN
  Administered 2016-07-18: 100 mL via INTRAVENOUS

## 2016-07-18 MED ORDER — ONDANSETRON HCL 4 MG/2ML IJ SOLN
4.0000 mg | Freq: Four times a day (QID) | INTRAMUSCULAR | Status: DC | PRN
  Administered 2016-07-18: 4 mg via INTRAVENOUS
  Filled 2016-07-18: qty 2

## 2016-07-18 MED ORDER — IOPAMIDOL (ISOVUE-300) INJECTION 61%
INTRAVENOUS | Status: AC
  Filled 2016-07-18: qty 100

## 2016-07-18 MED ORDER — OMEGA-3-ACID ETHYL ESTERS 1 G PO CAPS
3000.0000 mg | ORAL_CAPSULE | Freq: Two times a day (BID) | ORAL | Status: DC
  Administered 2016-07-19 – 2016-07-21 (×6): 3000 mg via ORAL
  Filled 2016-07-18 (×6): qty 3

## 2016-07-18 NOTE — ED Triage Notes (Signed)
PT c/o abdominal pain with sudden onset on 10pm last night; pt began vomiting this AM and has about 5 episodes; pt states she always has abnormal BM's and is often constipated, including presently; abdomen is mildly taut in triage; denies body aches and feeling ill

## 2016-07-18 NOTE — ED Notes (Signed)
Off floor for testing 
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
## 2016-07-18 NOTE — ED Notes (Signed)
hospitalist at bedside

## 2016-07-18 NOTE — ED Notes (Signed)
US at bedside

## 2016-07-18 NOTE — H&P (Signed)
History and Physical    Robin Brady SLP:[id number] DOB: 05/11/1946 DOA: 07/18/2016  PCP: Tula Nakayama   Patient coming from: Home   Chief Complaint: Abdominal pain.   HPI: Robin Brady is a 71 y.o. female with medical history significant of diabetes and hypertension who presents to the hospital chief complaint abdominal pain. The pain presented acutely, epigastric in location, 10/10 in intensity, no improving or worsening factors, associated with nausea and decreased po intake. No fever or chills. Patient had a large meal for lunch yesterday. Denies any alcohol abuse.   4 days ago she visited a family member with pancreatitis, (gallstone related)  ED Course:  Patient found dehydrated with high lipase. Referred for admission and further evaluation.   Review of Systems:  1. General. No fevers or chills, no waking a weight loss 2. ENT no runny nose or sore throat 3. Pulmonary no shortness of breath cough or hemoptysis 4. Cardiovascular no anginal claudication 5. Gastrointestinal no nausea vomiting or diarrhea, positive for abdominal pain as mentioned in history present illness 6. Hematology no easy bruisability or frequent infections 7. Endocrine a tremors, heat or cold intolerance 8. Urology no dysuria or increased urinary frequency 9. Dermatology no rashes 10. Skeletal no joint pain   Past Medical History:  Diagnosis Date  . Anemia   . Constipation   . Diabetes ([place])   . Diabetes mellitus without complication ([place])   . Hyperlipemia   . Hypertension   . Loss of consciousness ([place])   . Ptosis     Past Surgical History:  Procedure Laterality Date  . BREAST CYSTECTOMY Bilateral 1117   w/ Silicone implants  . BREAST IMPLANT REMOVAL Bilateral 1989   Replaced w/ saline implants  . CARDIOVASCULAR STRESS TEST  09/30/2008   No evidence of signifcant ischemia.   Marland Kitchen CAROTID ANGIOGRAM  06/03/2007   No evidence to suggest occlusion, stenosis, dissection, aneurysm, or  dural AV fistulas on images provided. ophthalmic artierieshave normal originsw/ normal capillary blush.  Marland Kitchen CATARACT EXTRACTION Right 2006  . CORNEAL TRANSPLANT Right 1960, 1986, [place], & 2004  . PARS PLANA VITRECTOMY W/ REPAIR OF MACULAR HOLE  2009  . PILONIDAL CYST EXCISION  1964  . TONSILLECTOMY  1948     reports that she has been smoking.  She has been smoking about 0.25 packs per day. She has never used smokeless tobacco. She reports that she does not drink alcohol or use drugs.  Allergies  Allergen Reactions  . Penicillins Hives    As a child. Tolerates cephalosporins. Has patient had a PCN reaction causing immediate rash, facial/tongue/throat swelling, SOB or lightheadedness with hypotension: No Has patient had a PCN reaction causing severe rash involving mucus membranes or skin necrosis: No Has patient had a PCN reaction that required hospitalization No Has patient had a PCN reaction occurring within the last 10 years: No If all of the above answers are "NO", then may proceed with Cephalosporin use.  . Statins Other (See Comments)    Muscle cramps  . Sulfa Antibiotics Swelling    angioedema  . Tussionex Pennkinetic Er [Hydrocod Polst-Cpm Polst Er]     "thought she was going to die" Cold sweat Bradycardia Tolerates hydrocodone  . Latex Rash    Local swelling    Family History  Problem Relation Age of Onset  . Multiple myeloma Mother   . Heart failure Father   . Diabetes Father   . Heart attack Father 33  . Diabetes Brother 65  .  Hypertension Brother 18  . Diabetes Paternal Grandmother   . Crohn's disease Brother 44  . Diabetes Maternal Grandfather   . Diabetes Brother 32  . Alcoholism Brother   . Ulcers Brother     Foot ulcers - developed MRSA following, 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   Unacceptable: Noncontributory, unremarkable, or negative. Acceptable: Family history reviewed and not pertinent (If you reviewed it)  Prior to Admission medications   Medication  Sig Start Date End Date Taking? Authorizing Provider  aspirin EC 81 MG tablet Take 81 mg by mouth every Monday, Wednesday, and Friday.   Yes Historical Provider, MD  Calcium Carbonate-Vitamin D 600-400 MG-UNIT tablet Take 1 tablet by mouth daily.   Yes Historical Provider, MD  Cholecalciferol (VITAMIN D) 2000 UNITS CAPS Take 2,000 Units by mouth daily.    Yes Historical Provider, MD  metFORMIN (GLUCOPHAGE-XR) 500 MG 24 hr tablet Take 500 mg by mouth daily with breakfast.   Yes Historical Provider, MD  metoprolol succinate (TOPROL-XL) 25 MG 24 hr tablet Take 25 mg by mouth daily.   Yes Historical Provider, MD  metroNIDAZOLE (METROGEL) 0.75 % gel Apply 1 application topically daily. 04/20/16  Yes Historical Provider, MD  mometasone (ELOCON) 0.1 % cream Apply 1 application topically daily as needed (rash).    Yes Historical Provider, MD  Omega 3 1000 MG CAPS Take 3,000 mg by mouth 2 (two) times daily with a meal.    Yes Historical Provider, MD  OVER THE COUNTER MEDICATION Take 1 tablet by mouth daily. Bio repair- multivitamin   Yes Historical Provider, MD  OVER THE COUNTER MEDICATION Take 1 scoop by mouth daily. All day energy greens, powder   Yes Historical Provider, MD  PrednisoLONE Acetate (PRED FORTE OP) Place 1 drop into both eyes daily.    Yes Historical Provider, MD  rosuvastatin (CRESTOR) 5 MG tablet Take 1 tablet by mouth every other day or as directed Patient taking differently: Take 5 mg by mouth every Monday, Wednesday, and Friday.  01/17/15  Yes Lorretta Harp, MD  vitamin B-12 (CYANOCOBALAMIN) 1000 MCG tablet Take 1,000 mcg by mouth daily.   Yes Historical Provider, MD  metoprolol (LOPRESSOR) 25 MG tablet Take 1 tablet (25 mg total) by mouth 2 (two) times daily. Patient not taking: Reported on 07/18/2016 08/31/13   Lorretta Harp, MD    Physical Exam: Vitals:   07/18/16 0605 07/18/16 0854 07/18/16 1127  BP: 134/73 157/74 134/61  Pulse: 69 (!) 53 (!) 56  Resp: 18 15 15   Temp: 98.5  F (36.9 C)    TempSrc: Oral    SpO2: 96% 96% 92%  Weight: 68 kg (150 lb)    Height: 5' 4"  (1.626 m)        Constitutional:  Deconditioned and ill looking appearing Vitals:   07/18/16 0605 07/18/16 0854 07/18/16 1127  BP: 134/73 157/74 134/61  Pulse: 69 (!) 53 (!) 56  Resp: 18 15 15   Temp: 98.5 F (36.9 C)    TempSrc: Oral    SpO2: 96% 96% 92%  Weight: 68 kg (150 lb)    Height: 5' 4"  (1.626 m)     Eyes: PERRL, lids and conjunctivae mild pale.  ENMT: Mucous membranes are dry. Posterior pharynx clear of any exudate or lesions.Normal dentition.  Neck: normal, supple, no masses, no thyromegaly Respiratory: clear to auscultation bilaterally, no wheezing, no crackles. Normal respiratory effort. No accessory muscle use. Mild decreased breath sounds at bases.  Cardiovascular: Regular rate and rhythm, no  murmurs / rubs / gallops. No extremity edema. 2+ pedal pulses. No carotid bruits.  Abdomen: tender to superficial and deep palpation, no masses palpated. No hepatosplenomegaly. Bowel sounds positive.  Musculoskeletal: no clubbing / cyanosis. No joint deformity upper and lower extremities. Good ROM, no contractures. Normal muscle tone.  Skin: no rashes, lesions, ulcers. No induration Neurologic: CN 2-12 grossly intact. Sensation intact, DTR normal. Strength 5/5 in all 4.    Labs on Admission: I have personally reviewed following labs and imaging studies  CBC:  Recent Labs Lab 07/18/16 0627  WBC 14.5*  HGB 13.4  HCT 39.2  MCV 96.3  PLT 073   Basic Metabolic Panel:  Recent Labs Lab 07/18/16 0627  NA 140  K 4.3  CL 105  CO2 27  GLUCOSE 158*  BUN 26*  CREATININE 0.90  CALCIUM 9.8   GFR: Estimated Creatinine Clearance: 54.3 mL/min (by C-G formula based on SCr of 0.9 mg/dL). Liver Function Tests:  Recent Labs Lab 07/18/16 0627  AST 24  ALT 16  ALKPHOS 48  BILITOT 0.6  PROT 7.5  ALBUMIN 4.4    Recent Labs Lab 07/18/16 0627  LIPASE 2,139*   No results  for input(s): AMMONIA in the last 168 hours. Coagulation Profile: No results for input(s): INR, PROTIME in the last 168 hours. Cardiac Enzymes: No results for input(s): CKTOTAL, CKMB, CKMBINDEX, TROPONINI in the last 168 hours. BNP (last 3 results) No results for input(s): PROBNP in the last 8760 hours. HbA1C: No results for input(s): HGBA1C in the last 72 hours. CBG: No results for input(s): GLUCAP in the last 168 hours. Lipid Profile: No results for input(s): CHOL, HDL, LDLCALC, TRIG, CHOLHDL, LDLDIRECT in the last 72 hours. Thyroid Function Tests: No results for input(s): TSH, T4TOTAL, FREET4, T3FREE, THYROIDAB in the last 72 hours. Anemia Panel: No results for input(s): VITAMINB12, FOLATE, FERRITIN, TIBC, IRON, RETICCTPCT in the last 72 hours. Urine analysis:    Component Value Date/Time   COLORURINE YELLOW 07/18/2016 0620   APPEARANCEUR HAZY (A) 07/18/2016 0620   LABSPEC 1.021 07/18/2016 0620   PHURINE 5.0 07/18/2016 0620   GLUCOSEU NEGATIVE 07/18/2016 0620   HGBUR MODERATE (A) 07/18/2016 0620   BILIRUBINUR NEGATIVE 07/18/2016 0620   KETONESUR NEGATIVE 07/18/2016 0620   PROTEINUR NEGATIVE 07/18/2016 0620   NITRITE NEGATIVE 07/18/2016 0620   LEUKOCYTESUR SMALL (A) 07/18/2016 0620   Sepsis Labs: !!!!!!!!!!!!!!!!!!!!!!!!!!!!!!!!!!!!!!!!!!!! @LABRCNTIP (procalcitonin:4,lacticidven:4) )No results found for this or any previous visit (from the past 240 hour(s)).   Radiological Exams on Admission: Ct Abdomen Pelvis W Contrast  Result Date: 07/18/2016 CLINICAL DATA:  Nausea, vomiting, abdominal pain and bloating since last night, elevated lipase, history diabetes mellitus, hyperlipidemia, hypertension, smoker EXAM: CT ABDOMEN AND PELVIS WITH CONTRAST TECHNIQUE: Multidetector CT imaging of the abdomen and pelvis was performed using the standard protocol following bolus administration of intravenous contrast. Sagittal and coronal MPR images reconstructed from axial data set.  CONTRAST:  16m ISOVUE-300 IOPAMIDOL (ISOVUE-300) INJECTION 61% IV. No oral contrast administered. COMPARISON:  None FINDINGS: Lower chest: Bibasilar atelectasis Hepatobiliary: Gallbladder and liver normal appearance. No biliary dilatation. Pancreas: Mild peripancreatic infiltrative changes with additional edema along the LEFT anterior pararenal fascia and adjacent to the second portion of the duodenum most consistent with mild diffuse pancreatitis. Single parenchymal calcification at the pancreatic head. No pancreatic mass or ductal dilatation. Spleen: Normal appearance Adrenals/Urinary Tract: Tiny nonobstructing calculus at inferior pole LEFT kidney image 34. Adrenal glands, kidneys, ureters, and bladder otherwise normal appearance. Stomach/Bowel: Normal appendix coiled adjacent  to cecal tip. Small hiatal hernia. Stomach and bowel loops otherwise normal in appearance for exam lacking GI contrast and adequate distention. No definite bowel dilatation or wall thickening. Vascular/Lymphatic: Atherosclerotic calcifications aorta without aneurysm. Few scattered pelvic phleboliths. No adenopathy. Reproductive: Unremarkable Other: No free air or free fluid.  No hernia. Musculoskeletal: Osseous demineralization. Old superior endplate compression fracture L1 slightly increased since 07/23/2013. IMPRESSION: Mild diffuse pancreatitis changes. Small hiatal hernia. Single tiny nonobstructing LEFT renal calculus. Aortic atherosclerosis. OldL1 superior plate compression fracture. Electronically Signed   By: Lavonia Dana M.D.   On: 07/18/2016 10:13    EKG: Independently reviewed. NA  Assessment/Plan Active Problems:   Acute pancreatitis  This is a 71 year old female who presents to the hospital with chief complaint of abdominal pain, her pain presented acutely abdominal pain since 10 PM last night, her pain has been persistent and worsening associated poor oral intake. On initial physical examination temperature 98.5,  blood pressure 130/73, heart rate 69, respiratory rate 18, oxygen saturation 96% on room air. Positive for paleness, oral mucosa is dry, her lungs are clear to auscultation, heart S1-S2 present rhythmic, abdomen is tender to palpation with no rebound or peritoneal signs. No lower extremity edema. Sodium 140, potassium 4.3, chloride 105, bicarbonate 27, glucose 158, BUN 26, creatinine 0.90, alkaline phosphatase 48, lipase 2139, AST 24, ALT 16, total bilirubin 0.6, white count 14.5, hemoglobin 13.4, hematocrit 39.2, platelets 250. Urine drug screen was 60-30 white cells, negative nitrates. CT of the abdomen and pelvis showing mild diffuse pancreatitis changes.  The patient will be admitted to the hospital with the working diagnosis of acute pancreatitis.  1. Acute pancreatitis. Will admit patient to medical floor, liquid diet and advance as tolerated, pain control with IV morphine. Establish antiacid and antiemetic therapy. Follow on ultrasonography of the abdomen and a lipase.   2. Hypertension. Will continue metoprolol, her home regimen.  3. Type 2 diabetes mellitus. Will hold oral hypoglycemic agents, will start patient on insulin sliding scale for glucose coverage and monitoring, hold on long-acting insulin until requirements been established.   4. Dyslipidemia. Continue rosuvastatin, check triglyceride level.    DVT prophylaxis: enoxaparin Code Status: full  Family Communication: I spoke with patient's husband at the beside and all questions were addressed.  Disposition Plan: home  Consults called: NA Admission status: Inpatient   Mauricio Gerome Apley MD [facility] Pager [phone]  If 7PM-7AM, please contact night-coverage [email or web address] Password [email or web address]  07/18/2016, 11:35 AM

## 2016-07-18 NOTE — ED Notes (Signed)
Patient given ice chips. 

## 2016-07-19 DIAGNOSIS — K8500 Idiopathic acute pancreatitis without necrosis or infection: Secondary | ICD-10-CM

## 2016-07-19 DIAGNOSIS — K859 Acute pancreatitis without necrosis or infection, unspecified: Principal | ICD-10-CM

## 2016-07-19 DIAGNOSIS — E784 Other hyperlipidemia: Secondary | ICD-10-CM

## 2016-07-19 DIAGNOSIS — I10 Essential (primary) hypertension: Secondary | ICD-10-CM

## 2016-07-19 LAB — URINALYSIS, ROUTINE W REFLEX MICROSCOPIC
Bacteria, UA: NONE SEEN
Bilirubin Urine: NEGATIVE
Glucose, UA: 150 mg/dL — AB
Hgb urine dipstick: NEGATIVE
Ketones, ur: NEGATIVE mg/dL
Leukocytes, UA: NEGATIVE
Nitrite: NEGATIVE
Protein, ur: 30 mg/dL — AB
Specific Gravity, Urine: 1.025 (ref 1.005–1.030)
pH: 5 (ref 5.0–8.0)

## 2016-07-19 LAB — COMPREHENSIVE METABOLIC PANEL
ALT: 12 U/L — ABNORMAL LOW (ref 14–54)
AST: 19 U/L (ref 15–41)
Albumin: 3.6 g/dL (ref 3.5–5.0)
Alkaline Phosphatase: 43 U/L (ref 38–126)
Anion gap: 9 (ref 5–15)
BUN: 16 mg/dL (ref 6–20)
CO2: 24 mmol/L (ref 22–32)
Calcium: 9 mg/dL (ref 8.9–10.3)
Chloride: 105 mmol/L (ref 101–111)
Creatinine, Ser: 0.95 mg/dL (ref 0.44–1.00)
GFR calc Af Amer: >60 mL/min (ref >60)
GFR calc non Af Amer: 59 mL/min — ABNORMAL LOW (ref >60)
Glucose, Bld: 128 mg/dL — ABNORMAL HIGH (ref 65–99)
Potassium: 4.2 mmol/L (ref 3.5–5.1)
Sodium: 138 mmol/L (ref 135–145)
Total Bilirubin: 0.5 mg/dL (ref 0.3–1.2)
Total Protein: 6.4 g/dL — ABNORMAL LOW (ref 6.5–8.1)

## 2016-07-19 LAB — LIPID PANEL
Cholesterol: 164 mg/dL (ref 0–200)
HDL: 60 mg/dL (ref >40)
LDL Cholesterol: 88 mg/dL (ref 0–99)
Total CHOL/HDL Ratio: 2.7 RATIO
Triglycerides: 82 mg/dL (ref <150)
VLDL: 16 mg/dL (ref 0–40)

## 2016-07-19 LAB — CBC
HCT: 37 % (ref 36.0–46.0)
Hemoglobin: 12.4 g/dL (ref 12.0–15.0)
MCH: 32.5 pg (ref 26.0–34.0)
MCHC: 33.5 g/dL (ref 30.0–36.0)
MCV: 97.1 fL (ref 78.0–100.0)
Platelets: 212 10*3/uL (ref 150–400)
RBC: 3.81 MIL/uL — ABNORMAL LOW (ref 3.87–5.11)
RDW: 13.4 % (ref 11.5–15.5)
WBC: 16.9 10*3/uL — ABNORMAL HIGH (ref 4.0–10.5)

## 2016-07-19 LAB — LIPASE, BLOOD: Lipase: 291 U/L — ABNORMAL HIGH (ref 11–51)

## 2016-07-19 MED ORDER — POLYETHYLENE GLYCOL 3350 17 G PO PACK
17.0000 g | PACK | Freq: Every day | ORAL | Status: DC
  Administered 2016-07-19 – 2016-07-21 (×3): 17 g via ORAL
  Filled 2016-07-19 (×3): qty 1

## 2016-07-19 NOTE — Progress Notes (Signed)
PROGRESS NOTE  Baker PieriniRachel S Brady UJW:[id number] DOB: 08/07/1945 DOA: 07/18/2016 PCP: Lilia ArgueKAPLAN,KRISTEN, PA-C  Brief History:  71 year old female with a history of diabetes mellitus, hypertension, hyperlipidemia presenting with one-day history of abdominal pain in the epigastric and periumbilical area that began abruptly on the evening of 07/17/2016. The patient denies any new medications, alcohol, or illicit drugs. He denies any fevers, chills, chest pain, short of breath, dysuria, hematuria. She had numerous episodes of emesis without blood. Upon presentation, she was noted to have normal LFTs with lipase 2139. CT of the abdomen and pelvis revealed mild peripancreatic infiltrative changes and additional edema along the left anterior pararenal fascia and adjacent duodenum consistent with pancreatitis.   Assessment/Plan: Acute pancreatitis -07/18/2016 RUQ US--negative for cholelithiasis or gallbladder wall thickening -Triglycerides 82 -Etiology unclear, but clinically improving -Continue clear liquids -Pain control with IV morphine -Lipase 2139-->291  Hypertension -Controlled -Continue metoprolol  Diabetes mellitus type 2 -NovoLog sliding scale -Hemoglobin A1c  Leukocytosis -likely stress demargination -Check UA and urine culture  Hyperlipidemia -Continue Crestor and lovaza   Disposition Plan:   Home in 2-3 days  Family Communication:  No Family at bedside--Total time spent 35 minutes.  Greater than 50% spent face to face counseling and coordinating care.   Consultants:  none  Code Status:  FULL / DNR  DVT Prophylaxis:  [place] Lovenox   Procedures: As Listed in Progress Note Above  Antibiotics: None    Subjective:  abdominal pain is improving but still present in the epigastric and periumbilical area. She denies any vomiting, diarrhea, dysuria, hematuria. Denies any fevers, chills, chest pain, coughing, hemoptysis, dysuria, hematuria. No hematochezia or  melena.  Objective: Vitals:   07/18/16 1500 07/18/16 1600 07/18/16 2012 07/19/16 0443  BP: 140/58 130/61 (!) 117/51 123/61  Pulse: (!) 58 (!) 57 65 62  Resp: 16 16 10 12   Temp:  98.5 F (36.9 C) 99.9 F (37.7 C) 99 F (37.2 C)  TempSrc:  Oral Oral Oral  SpO2: 93% 95% 95% 95%  Weight:  68 kg (150 lb)    Height:  5\' 4"  (1.626 m)      Intake/Output Summary (Last 24 hours) at 07/19/16 0958 Last data filed at 07/18/16 1800  Gross per 24 hour  Intake            147.5 ml  Output                0 ml  Net            147.5 ml   Weight change: 0.001 kg (0 oz) Exam:   General:  Pt is alert, follows commands appropriately, not in acute distress  HEENT: No icterus, No thrush, No neck mass, [place]/AT  Cardiovascular: RRR, S1/S2, no rubs, no gallops  Respiratory: CTA bilaterally, no wheezing, no crackles, no rhonchi  Abdomen: Soft/+BS, Epigastric and periumbilical  tender, non distended, no guarding  Extremities: No edema, No lymphangitis, No petechiae, No rashes, no synovitis   Data Reviewed: I have personally reviewed following labs and imaging studies Basic Metabolic Panel:  Recent Labs Lab 07/18/16 0627 07/18/16 1610 07/19/16 0406  NA 140  --  138  K 4.3  --  4.2  CL 105  --  105  CO2 27  --  24  GLUCOSE 158*  --  128*  BUN 26*  --  16  CREATININE 0.90 0.99 0.95  CALCIUM 9.8  --  9.0   Liver  Function Tests:  Recent Labs Lab 07/18/16 0627 07/19/16 0406  AST 24 19  ALT 16 12*  ALKPHOS 48 43  BILITOT 0.6 0.5  PROT 7.5 6.4*  ALBUMIN 4.4 3.6    Recent Labs Lab 07/18/16 0627 07/19/16 0406  LIPASE 2,139* 291*   No results for input(s): AMMONIA in the last 168 hours. Coagulation Profile: No results for input(s): INR, PROTIME in the last 168 hours. CBC:  Recent Labs Lab 07/18/16 0627 07/18/16 1610 07/19/16 0406  WBC 14.5* 15.9* 16.9*  HGB 13.4 13.1 12.4  HCT 39.2 38.6 37.0  MCV 96.3 96.7 97.1  PLT 250 214 212   Cardiac Enzymes: No results for  input(s): CKTOTAL, CKMB, CKMBINDEX, TROPONINI in the last 168 hours. BNP: Invalid input(s): POCBNP CBG: No results for input(s): GLUCAP in the last 168 hours. HbA1C: No results for input(s): HGBA1C in the last 72 hours. Urine analysis:    Component Value Date/Time   COLORURINE YELLOW 07/18/2016 0620   APPEARANCEUR HAZY (A) 07/18/2016 0620   LABSPEC 1.021 07/18/2016 0620   PHURINE 5.0 07/18/2016 0620   GLUCOSEU NEGATIVE 07/18/2016 0620   HGBUR MODERATE (A) 07/18/2016 0620   BILIRUBINUR NEGATIVE 07/18/2016 0620   KETONESUR NEGATIVE 07/18/2016 0620   PROTEINUR NEGATIVE 07/18/2016 0620   NITRITE NEGATIVE 07/18/2016 0620   LEUKOCYTESUR SMALL (A) 07/18/2016 0620   Sepsis Labs: @LABRCNTIP (procalcitonin:4,lacticidven:4) )No results found for this or any previous visit (from the past 240 hour(s)).   Scheduled Meds: . aspirin EC  81 mg Oral Q M,W,F  . calcium-vitamin D  1 tablet Oral Daily  . cholecalciferol  2,000 Units Oral Daily  . enoxaparin (LOVENOX) injection  40 mg Subcutaneous Q24H  . metoprolol succinate  25 mg Oral Daily  . omega-3 acid ethyl esters  3,000 mg Oral BID WC  . prednisoLONE acetate  1 drop Both Eyes Daily  . rosuvastatin  5 mg Oral Q M,W,F  . vitamin B-12  1,000 mcg Oral Daily   Continuous Infusions: . dextrose 5 % and 0.45% NaCl 75 mL/hr at 07/19/16 0457    Procedures/Studies: Ct Abdomen Pelvis W Contrast  Result Date: 07/18/2016 CLINICAL DATA:  Nausea, vomiting, abdominal pain and bloating since last night, elevated lipase, history diabetes mellitus, hyperlipidemia, hypertension, smoker EXAM: CT ABDOMEN AND PELVIS WITH CONTRAST TECHNIQUE: Multidetector CT imaging of the abdomen and pelvis was performed using the standard protocol following bolus administration of intravenous contrast. Sagittal and coronal MPR images reconstructed from axial data set. CONTRAST:  ISOVUE-300 IOPAMIDOL (ISOVUE-300) INJECTION 61% IV. No oral contrast administered.  COMPARISON:  None FINDINGS: Lower chest: Bibasilar atelectasis Hepatobiliary: Gallbladder and liver normal appearance. No biliary dilatation. Pancreas: Mild peripancreatic infiltrative changes with additional edema along the LEFT anterior pararenal fascia and adjacent to the second portion of the duodenum most consistent with mild diffuse pancreatitis. Single parenchymal calcification at the pancreatic head. No pancreatic mass or ductal dilatation. Spleen: Normal appearance Adrenals/Urinary Tract: Tiny nonobstructing calculus at inferior pole LEFT kidney image 34. Adrenal glands, kidneys, ureters, and bladder otherwise normal appearance. Stomach/Bowel: Normal appendix coiled adjacent to cecal tip. Small hiatal hernia. Stomach and bowel loops otherwise normal in appearance for exam lacking GI contrast and adequate distention. No definite bowel dilatation or wall thickening. Vascular/Lymphatic: Atherosclerotic calcifications aorta without aneurysm. Few scattered pelvic phleboliths. No adenopathy. Reproductive: Unremarkable Other: No free air or free fluid.  No hernia. Musculoskeletal: Osseous demineralization. Old superior endplate compression fracture L1 slightly increased since 07/23/2013. IMPRESSION: Mild diffuse pancreatitis changes. Small hiatal  hernia. Single tiny nonobstructing LEFT renal calculus. Aortic atherosclerosis. OldL1 superior plate compression fracture. Electronically Signed   By: Ulyses Southward M.D.   On: 07/18/2016 10:13   US Abdomen Limited  Result Date: 07/18/2016 CLINICAL DATA:  Abdominal pain for 1 day.  Pancreatitis. EXAM: US ABDOMEN LIMITED - RIGHT UPPER QUADRANT COMPARISON:  None. FINDINGS: Gallbladder: No gallstones or wall thickening visualized. No sonographic Murphy sign noted by sonographer. Common bile duct: Diameter: 4.3 mm Liver: No focal lesion identified. Within normal limits in parenchymal echogenicity. IMPRESSION: 1. No cholelithiasis or sonographic evidence of acute  cholecystitis. Electronically Signed   By: Elige Ko   On: 07/18/2016 13:41    TAT, DAVID, DO  [facility] Pager [phone]  If 7PM-7AM, please contact night-coverage [email or web address] Password [email or web address] 07/19/2016, 9:58 AM   LOS: 1 day

## 2016-07-20 ENCOUNTER — Inpatient Hospital Stay (HOSPITAL_COMMUNITY): Payer: Medicare Other

## 2016-07-20 DIAGNOSIS — E119 Type 2 diabetes mellitus without complications: Secondary | ICD-10-CM

## 2016-07-20 LAB — URINE CULTURE: Culture: <10000 — AB

## 2016-07-20 LAB — CBC
HCT: 34.6 % — ABNORMAL LOW (ref 36.0–46.0)
Hemoglobin: 11.7 g/dL — ABNORMAL LOW (ref 12.0–15.0)
MCH: 32.7 pg (ref 26.0–34.0)
MCHC: 33.8 g/dL (ref 30.0–36.0)
MCV: 96.6 fL (ref 78.0–100.0)
Platelets: 197 10*3/uL (ref 150–400)
RBC: 3.58 MIL/uL — ABNORMAL LOW (ref 3.87–5.11)
RDW: 13.2 % (ref 11.5–15.5)
WBC: 17.3 10*3/uL — ABNORMAL HIGH (ref 4.0–10.5)

## 2016-07-20 LAB — HEMOGLOBIN A1C
Hgb A1c MFr Bld: 5.6 % (ref 4.8–5.6)
Mean Plasma Glucose: 114 mg/dL

## 2016-07-20 LAB — BASIC METABOLIC PANEL
Anion gap: 6 (ref 5–15)
BUN: 10 mg/dL (ref 6–20)
CO2: 25 mmol/L (ref 22–32)
Calcium: 8.7 mg/dL — ABNORMAL LOW (ref 8.9–10.3)
Chloride: 105 mmol/L (ref 101–111)
Creatinine, Ser: 0.85 mg/dL (ref 0.44–1.00)
GFR calc Af Amer: >60 mL/min (ref >60)
GFR calc non Af Amer: >60 mL/min (ref >60)
Glucose, Bld: 149 mg/dL — ABNORMAL HIGH (ref 65–99)
Potassium: 3.7 mmol/L (ref 3.5–5.1)
Sodium: 136 mmol/L (ref 135–145)

## 2016-07-20 MED ORDER — SODIUM CHLORIDE 0.9 % IV SOLN: INTRAVENOUS | Status: DC

## 2016-07-20 MED ORDER — POTASSIUM CHLORIDE IN NACL 20-0.9 MEQ/L-% IV SOLN
INTRAVENOUS | Status: DC
  Administered 2016-07-20 – 2016-07-21 (×2): via INTRAVENOUS
  Filled 2016-07-20 (×4): qty 1000

## 2016-07-20 NOTE — Progress Notes (Signed)
PROGRESS NOTE  Robin Brady XLK:[id number] DOB: 04/29/1946 DOA: 07/18/2016 PCP: Lilia ArgueKAPLAN,KRISTEN, PA-C  Brief History:  71 year old female with a history of diabetes mellitus, hypertension, hyperlipidemia presenting with one-day history of abdominal pain in the epigastric and periumbilical area that began abruptly on the evening of 07/17/2016. The patient denies any new medications, alcohol, or illicit drugs. He denies any fevers, chills, chest pain, short of breath, dysuria, hematuria. She had numerous episodes of emesis without blood. Upon presentation, she was noted to have normal LFTs with lipase 2139. CT of the abdomen and pelvis revealed mild peripancreatic infiltrative changes and additional edema along the left anterior pararenal fascia and adjacent duodenum consistent with pancreatitis.   Assessment/Plan: Acute pancreatitis -07/18/2016 RUQ US--negative for cholelithiasis or gallbladder wall thickening -Triglycerides 82 -Etiology unclear, but clinically improving -Advance to full liquids -Pain control with IV morphine -Lipase 2139-->291  Hypertension -Controlled -Continue metoprolol  Diabetes mellitus type 2 -NovoLog sliding scale -Hemoglobin A1c--5.6  Leukocytosis -likely stress demargination -Check UA --no pyuria -CXR -CBC with diff in am  Hyperlipidemia -Continue Crestor and lovaza   Disposition Plan:   Home in 1-2 days  Family Communication:  No Family at bedside   Consultants:  none  Code Status:  FULL  DVT Prophylaxis:  [place] Lovenox   Procedures: As Listed in Progress Note Above  Antibiotics: None    Subjective: Overall, patient is feeling better. She feels like there is some bloating in her stomach. Denies any nausea, vomiting, diarrhea, dysuria, hematuria. Denies any headaches. No chest pain, coughing, shortness of breath.  Objective: Vitals:   07/19/16 1021 07/19/16 1345 07/19/16 2144 07/20/16 0527  BP: (!) 128/59 (!)  146/61 134/66 130/68  Pulse: 62 (!) 58 (!) 59 62  Resp: 16 16 14 14   Temp:  98.8 F (37.1 C) 98.7 F (37.1 C) 98.1 F (36.7 C)  TempSrc:  Oral Oral Oral  SpO2: 94% 97% 97% 96%  Weight:      Height:        Intake/Output Summary (Last 24 hours) at 07/20/16 1506 Last data filed at 07/20/16 0527  Gross per 24 hour  Intake              765 ml  Output                0 ml  Net              765 ml   Weight change:  Exam:   General:  Pt is alert, follows commands appropriately, not in acute distress  HEENT: No icterus, No thrush, No neck mass, [place]/AT  Cardiovascular: RRR, S1/S2, no rubs, no gallops  Respiratory: Bibasilar crackles. No wheezing.  Abdomen: Soft/+BS, non tender, non distended, no guarding  Extremities: No edema, No lymphangitis, No petechiae, No rashes, no synovitis   Data Reviewed: I have personally reviewed following labs and imaging studies Basic Metabolic Panel:  Recent Labs Lab 07/18/16 0627 07/18/16 1610 07/19/16 0406 07/20/16 0437  NA 140  --  138 136  K 4.3  --  4.2 3.7  CL 105  --  105 105  CO2 27  --  24 25  GLUCOSE 158*  --  128* 149*  BUN 26*  --  16 10  CREATININE 0.90 0.99 0.95 0.85  CALCIUM 9.8  --  9.0 8.7*   Liver Function Tests:  Recent Labs Lab 07/18/16 0627 07/19/16 0406  AST 24 19  ALT  16 12*  ALKPHOS 48 43  BILITOT 0.6 0.5  PROT 7.5 6.4*  ALBUMIN 4.4 3.6    Recent Labs Lab 07/18/16 0627 07/19/16 0406  LIPASE 2,139* 291*   No results for input(s): AMMONIA in the last 168 hours. Coagulation Profile: No results for input(s): INR, PROTIME in the last 168 hours. CBC:  Recent Labs Lab 07/18/16 0627 07/18/16 1610 07/19/16 0406 07/20/16 0437  WBC 14.5* 15.9* 16.9* 17.3*  HGB 13.4 13.1 12.4 11.7*  HCT 39.2 38.6 37.0 34.6*  MCV 96.3 96.7 97.1 96.6  PLT 250 214 212 197   Cardiac Enzymes: No results for input(s): CKTOTAL, CKMB, CKMBINDEX, TROPONINI in the last 168 hours. BNP: Invalid input(s):  POCBNP CBG: No results for input(s): GLUCAP in the last 168 hours. HbA1C:  Recent Labs  07/19/16 0406  HGBA1C 5.6   Urine analysis:    Component Value Date/Time   COLORURINE YELLOW 07/19/2016 1129   APPEARANCEUR CLEAR 07/19/2016 1129   LABSPEC 1.025 07/19/2016 1129   PHURINE 5.0 07/19/2016 1129   GLUCOSEU 150 (A) 07/19/2016 1129   HGBUR NEGATIVE 07/19/2016 1129   BILIRUBINUR NEGATIVE 07/19/2016 1129   KETONESUR NEGATIVE 07/19/2016 1129   PROTEINUR 30 (A) 07/19/2016 1129   NITRITE NEGATIVE 07/19/2016 1129   LEUKOCYTESUR NEGATIVE 07/19/2016 1129   Sepsis Labs: @LABRCNTIP (procalcitonin:4,lacticidven:4) ) Recent Results (from the past 240 hour(s))  Urine culture     Status: Abnormal   Collection Time: 07/19/16 11:29 AM  Result Value Ref Range Status   Specimen Description URINE, CLEAN CATCH  Final   Special Requests NONE  Final   Culture (A)  Final    <10,000 COLONIES/mL INSIGNIFICANT GROWTH Performed at [facility], [address] [place], [place] [address]    Report Status 07/20/2016 FINAL  Final     Scheduled Meds: . aspirin EC  81 mg Oral Q M,W,F  . calcium-vitamin D  1 tablet Oral Daily  . cholecalciferol  2,000 Units Oral Daily  . enoxaparin (LOVENOX) injection  40 mg Subcutaneous Q24H  . metoprolol succinate  25 mg Oral Daily  . omega-3 acid ethyl esters  3,000 mg Oral BID WC  . polyethylene glycol  17 g Oral Daily  . prednisoLONE acetate  1 drop Both Eyes Daily  . rosuvastatin  5 mg Oral Q M,W,F  . vitamin B-12  1,000 mcg Oral Daily   Continuous Infusions: . dextrose 5 % and 0.45% NaCl 75 mL/hr at 07/20/16 6045    Procedures/Studies: Dg Chest 2 View  Result Date: 07/20/2016 CLINICAL DATA:  Leukocytosis and low grade fever. EXAM: CHEST  2 VIEW COMPARISON:  07/23/2013 FINDINGS: Heart size is normal. Mediastinal shadows are normal except for some aortic calcification. There is patchy infiltrate and volume loss in both lower lobes. Upper lungs are  clear. No evidence of heart failure or effusion. No acute bone finding. IMPRESSION: Bilateral lower lobe infiltrate/volume loss. No dense consolidation or lobar collapse. Electronically Signed   By: Paulina Fusi M.D.   On: 07/20/2016 14:48   Ct Abdomen Pelvis W Contrast  Result Date: 07/18/2016 CLINICAL DATA:  Nausea, vomiting, abdominal pain and bloating since last night, elevated lipase, history diabetes mellitus, hyperlipidemia, hypertension, smoker EXAM: CT ABDOMEN AND PELVIS WITH CONTRAST TECHNIQUE: Multidetector CT imaging of the abdomen and pelvis was performed using the standard protocol following bolus administration of intravenous contrast. Sagittal and coronal MPR images reconstructed from axial data set. CONTRAST:  ISOVUE-300 IOPAMIDOL (ISOVUE-300) INJECTION 61% IV. No oral contrast administered.  COMPARISON:  None FINDINGS: Lower chest: Bibasilar atelectasis Hepatobiliary: Gallbladder and liver normal appearance. No biliary dilatation. Pancreas: Mild peripancreatic infiltrative changes with additional edema along the LEFT anterior pararenal fascia and adjacent to the second portion of the duodenum most consistent with mild diffuse pancreatitis. Single parenchymal calcification at the pancreatic head. No pancreatic mass or ductal dilatation. Spleen: Normal appearance Adrenals/Urinary Tract: Tiny nonobstructing calculus at inferior pole LEFT kidney image 34. Adrenal glands, kidneys, ureters, and bladder otherwise normal appearance. Stomach/Bowel: Normal appendix coiled adjacent to cecal tip. Small hiatal hernia. Stomach and bowel loops otherwise normal in appearance for exam lacking GI contrast and adequate distention. No definite bowel dilatation or wall thickening. Vascular/Lymphatic: Atherosclerotic calcifications aorta without aneurysm. Few scattered pelvic phleboliths. No adenopathy. Reproductive: Unremarkable Other: No free air or free fluid.  No hernia. Musculoskeletal: Osseous  demineralization. Old superior endplate compression fracture L1 slightly increased since 07/23/2013. IMPRESSION: Mild diffuse pancreatitis changes. Small hiatal hernia. Single tiny nonobstructing LEFT renal calculus. Aortic atherosclerosis. OldL1 superior plate compression fracture. Electronically Signed   By: Ulyses Southward M.D.   On: 07/18/2016 10:13   US Abdomen Limited  Result Date: 07/18/2016 CLINICAL DATA:  Abdominal pain for 1 day.  Pancreatitis. EXAM: US ABDOMEN LIMITED - RIGHT UPPER QUADRANT COMPARISON:  None. FINDINGS: Gallbladder: No gallstones or wall thickening visualized. No sonographic Murphy sign noted by sonographer. Common bile duct: Diameter: 4.3 mm Liver: No focal lesion identified. Within normal limits in parenchymal echogenicity. IMPRESSION: 1. No cholelithiasis or sonographic evidence of acute cholecystitis. Electronically Signed   By: Elige Ko   On: 07/18/2016 13:41    TAT, DAVID, DO  [facility] Pager [phone]  If 7PM-7AM, please contact night-coverage [email or web address] Password [email or web address] 07/20/2016, 3:06 PM   LOS: 2 days

## 2016-07-21 DIAGNOSIS — D72829 Elevated white blood cell count, unspecified: Secondary | ICD-10-CM

## 2016-07-21 DIAGNOSIS — D72823 Leukemoid reaction: Secondary | ICD-10-CM

## 2016-07-21 LAB — CBC WITH DIFFERENTIAL/PLATELET
Basophils Absolute: 0 10*3/uL (ref 0.0–0.1)
Basophils Relative: 0 %
Eosinophils Absolute: 0.1 10*3/uL (ref 0.0–0.7)
Eosinophils Relative: 1 %
HCT: 34.8 % — ABNORMAL LOW (ref 36.0–46.0)
Hemoglobin: 11.8 g/dL — ABNORMAL LOW (ref 12.0–15.0)
Lymphocytes Relative: 18 %
Lymphs Abs: 2.5 10*3/uL (ref 0.7–4.0)
MCH: 32.7 pg (ref 26.0–34.0)
MCHC: 33.9 g/dL (ref 30.0–36.0)
MCV: 96.4 fL (ref 78.0–100.0)
Monocytes Absolute: 1.5 10*3/uL — ABNORMAL HIGH (ref 0.1–1.0)
Monocytes Relative: 11 %
Neutro Abs: 9.5 10*3/uL — ABNORMAL HIGH (ref 1.7–7.7)
Neutrophils Relative %: 70 %
Platelets: 199 10*3/uL (ref 150–400)
RBC: 3.61 MIL/uL — ABNORMAL LOW (ref 3.87–5.11)
RDW: 13.1 % (ref 11.5–15.5)
WBC: 13.5 10*3/uL — ABNORMAL HIGH (ref 4.0–10.5)

## 2016-07-21 LAB — LIPASE, BLOOD: Lipase: 21 U/L (ref 11–51)

## 2016-07-21 LAB — BASIC METABOLIC PANEL
Anion gap: 7 (ref 5–15)
BUN: 8 mg/dL (ref 6–20)
CO2: 24 mmol/L (ref 22–32)
Calcium: 8.7 mg/dL — ABNORMAL LOW (ref 8.9–10.3)
Chloride: 107 mmol/L (ref 101–111)
Creatinine, Ser: 0.79 mg/dL (ref 0.44–1.00)
GFR calc Af Amer: >60 mL/min (ref >60)
GFR calc non Af Amer: >60 mL/min (ref >60)
Glucose, Bld: 100 mg/dL — ABNORMAL HIGH (ref 65–99)
Potassium: 3.9 mmol/L (ref 3.5–5.1)
Sodium: 138 mmol/L (ref 135–145)

## 2016-07-21 MED ORDER — BISACODYL 10 MG RE SUPP
10.0000 mg | Freq: Once | RECTAL | Status: AC
  Administered 2016-07-21: 10 mg via RECTAL
  Filled 2016-07-21: qty 1

## 2016-07-21 NOTE — Progress Notes (Signed)
PROGRESS NOTE  Robin PieriniRachel S Brady JXB:[id number] DOB: 10/14/1945 DOA: 07/18/2016 PCP: Lilia ArgueKAPLAN,KRISTEN, PA-C   Brief History:  71 year old female with a history of diabetes mellitus, hypertension, hyperlipidemia presenting with one-day history of abdominal pain in the epigastric and periumbilical area that began abruptly on the evening of 07/17/2016. The patient denies any new medications, alcohol, or illicit drugs. He denies any fevers, chills, chest pain, short of breath, dysuria, hematuria. She had numerous episodes of emesis without blood. Upon presentation, she was noted to have normal LFTs with lipase 2139. CT of the abdomen and pelvis revealed mild peripancreatic infiltrative changes and additional edema along the left anterior pararenal fascia and adjacent duodenum consistent with pancreatitis.   Assessment/Plan: Acute pancreatitis -07/18/2016 RUQ US--negative for cholelithiasis or gallbladder wall thickening -Triglycerides 82 -Etiology unclear, but clinically improving -Advance to soft diet -Pain control with IV morphine -Lipase 2139-->291  Hypertension -Controlled -Continue metoprolol  Diabetes mellitus type 2 -NovoLog sliding scale -Hemoglobin A1c--5.6  Leukocytosis -likely stress demargination -Check UA --no pyuria -CXR--bibasilar atelectasis -Starting to improve -CBC with diff in am  Hyperlipidemia -Continue Crestor and lovaza  Constipation -continue miralax -add bisacodyl supp   Disposition Plan: Home 07/22/16 if stable Family Communication: NoFamily at bedside   Consultants: none  Code Status: FULL  DVT Prophylaxis: [place] Lovenox   Procedures: As Listed in Progress Note Above  Antibiotics: None    Subjective: Patient states that abdominal pain overall is improving, but she feels constipated. She was passing flatus. Denies any fevers, chills, chest pain, nausea, vomiting, dysuria, hematuria. No  rashes.  Objective: Vitals:   07/20/16 1430 07/20/16 2110 07/21/16 0541 07/21/16 1322  BP: 140/63 (!) 153/61 (!) 150/65 136/68  Pulse: 68 68 61 61  Resp: 18 18 14 16   Temp: 98.8 F (37.1 C) 99.5 F (37.5 C) 98.5 F (36.9 C) 98.3 F (36.8 C)  TempSrc: Oral Oral Oral Oral  SpO2: 97% 97% 97% 98%  Weight:      Height:        Intake/Output Summary (Last 24 hours) at 07/21/16 1611 Last data filed at 07/21/16 1322  Gross per 24 hour  Intake             1380 ml  Output                0 ml  Net             1380 ml   Weight change:  Exam:   General:  Pt is alert, follows commands appropriately, not in acute distress  HEENT: No icterus, No thrush, No neck mass, [place]/AT  Cardiovascular: RRR, S1/S2, no rubs, no gallops  Respiratory: CTA bilaterally, no wheezing, no crackles, no rhonchi  Abdomen: Soft/+BS, Periumbilical tender, non distended, no guarding  Extremities: No edema, No lymphangitis, No petechiae, No rashes, no synovitis   Data Reviewed: I have personally reviewed following labs and imaging studies Basic Metabolic Panel:  Recent Labs Lab 07/18/16 0627 07/18/16 1610 07/19/16 0406 07/20/16 0437 07/21/16 0341  NA 140  --  138 136 138  K 4.3  --  4.2 3.7 3.9  CL 105  --  105 105 107  CO2 27  --  24 25 24   GLUCOSE 158*  --  128* 149* 100*  BUN 26*  --  16 10 8   CREATININE 0.90 0.99 0.95 0.85 0.79  CALCIUM 9.8  --  9.0 8.7* 8.7*   Liver Function Tests:  Recent Labs Lab 07/18/16 0627 07/19/16 0406  AST 24 19  ALT 16 12*  ALKPHOS 48 43  BILITOT 0.6 0.5  PROT 7.5 6.4*  ALBUMIN 4.4 3.6    Recent Labs Lab 07/18/16 0627 07/19/16 0406 07/21/16 0341  LIPASE 2,139* 291* 21   No results for input(s): AMMONIA in the last 168 hours. Coagulation Profile: No results for input(s): INR, PROTIME in the last 168 hours. CBC:  Recent Labs Lab 07/18/16 0627 07/18/16 1610 07/19/16 0406 07/20/16 0437 07/21/16 0341  WBC 14.5* 15.9* 16.9* 17.3* 13.5*   NEUTROABS  --   --   --   --  9.5*  HGB 13.4 13.1 12.4 11.7* 11.8*  HCT 39.2 38.6 37.0 34.6* 34.8*  MCV 96.3 96.7 97.1 96.6 96.4  PLT 250 214 212 197 199   Cardiac Enzymes: No results for input(s): CKTOTAL, CKMB, CKMBINDEX, TROPONINI in the last 168 hours. BNP: Invalid input(s): POCBNP CBG: No results for input(s): GLUCAP in the last 168 hours. HbA1C:  Recent Labs  07/19/16 0406  HGBA1C 5.6   Urine analysis:    Component Value Date/Time   COLORURINE YELLOW 07/19/2016 1129   APPEARANCEUR CLEAR 07/19/2016 1129   LABSPEC 1.025 07/19/2016 1129   PHURINE 5.0 07/19/2016 1129   GLUCOSEU 150 (A) 07/19/2016 1129   HGBUR NEGATIVE 07/19/2016 1129   BILIRUBINUR NEGATIVE 07/19/2016 1129   KETONESUR NEGATIVE 07/19/2016 1129   PROTEINUR 30 (A) 07/19/2016 1129   NITRITE NEGATIVE 07/19/2016 1129   LEUKOCYTESUR NEGATIVE 07/19/2016 1129   Sepsis Labs: @LABRCNTIP (procalcitonin:4,lacticidven:4) ) Recent Results (from the past 240 hour(s))  Urine culture     Status: Abnormal   Collection Time: 07/19/16 11:29 AM  Result Value Ref Range Status   Specimen Description URINE, CLEAN CATCH  Final   Special Requests NONE  Final   Culture (A)  Final    <10,000 COLONIES/mL INSIGNIFICANT GROWTH Performed at [facility], [address] [place], [place] [address]    Report Status 07/20/2016 FINAL  Final     Scheduled Meds: . aspirin EC  81 mg Oral Q M,W,F  . calcium-vitamin D  1 tablet Oral Daily  . cholecalciferol  2,000 Units Oral Daily  . enoxaparin (LOVENOX) injection  40 mg Subcutaneous Q24H  . metoprolol succinate  25 mg Oral Daily  . omega-3 acid ethyl esters  3,000 mg Oral BID WC  . polyethylene glycol  17 g Oral Daily  . prednisoLONE acetate  1 drop Both Eyes Daily  . rosuvastatin  5 mg Oral Q M,W,F  . vitamin B-12  1,000 mcg Oral Daily   Continuous Infusions: . 0.9 % NaCl with KCl 20 mEq / L 75 mL/hr at 07/21/16 6045    Procedures/Studies: Dg Chest 2 View  Result  Date: 07/20/2016 CLINICAL DATA:  Leukocytosis and low grade fever. EXAM: CHEST  2 VIEW COMPARISON:  07/23/2013 FINDINGS: Heart size is normal. Mediastinal shadows are normal except for some aortic calcification. There is patchy infiltrate and volume loss in both lower lobes. Upper lungs are clear. No evidence of heart failure or effusion. No acute bone finding. IMPRESSION: Bilateral lower lobe infiltrate/volume loss. No dense consolidation or lobar collapse. Electronically Signed   By: Paulina Fusi M.D.   On: 07/20/2016 14:48   Ct Abdomen Pelvis W Contrast  Result Date: 07/18/2016 CLINICAL DATA:  Nausea, vomiting, abdominal pain and bloating since last night, elevated lipase, history diabetes mellitus, hyperlipidemia, hypertension, smoker EXAM: CT ABDOMEN AND PELVIS WITH CONTRAST TECHNIQUE: Multidetector CT  imaging of the abdomen and pelvis was performed using the standard protocol following bolus administration of intravenous contrast. Sagittal and coronal MPR images reconstructed from axial data set. CONTRAST:  ISOVUE-300 IOPAMIDOL (ISOVUE-300) INJECTION 61% IV. No oral contrast administered. COMPARISON:  None FINDINGS: Lower chest: Bibasilar atelectasis Hepatobiliary: Gallbladder and liver normal appearance. No biliary dilatation. Pancreas: Mild peripancreatic infiltrative changes with additional edema along the LEFT anterior pararenal fascia and adjacent to the second portion of the duodenum most consistent with mild diffuse pancreatitis. Single parenchymal calcification at the pancreatic head. No pancreatic mass or ductal dilatation. Spleen: Normal appearance Adrenals/Urinary Tract: Tiny nonobstructing calculus at inferior pole LEFT kidney image 34. Adrenal glands, kidneys, ureters, and bladder otherwise normal appearance. Stomach/Bowel: Normal appendix coiled adjacent to cecal tip. Small hiatal hernia. Stomach and bowel loops otherwise normal in appearance for exam lacking GI contrast and adequate  distention. No definite bowel dilatation or wall thickening. Vascular/Lymphatic: Atherosclerotic calcifications aorta without aneurysm. Few scattered pelvic phleboliths. No adenopathy. Reproductive: Unremarkable Other: No free air or free fluid.  No hernia. Musculoskeletal: Osseous demineralization. Old superior endplate compression fracture L1 slightly increased since 07/23/2013. IMPRESSION: Mild diffuse pancreatitis changes. Small hiatal hernia. Single tiny nonobstructing LEFT renal calculus. Aortic atherosclerosis. OldL1 superior plate compression fracture. Electronically Signed   By: Ulyses Southward M.D.   On: 07/18/2016 10:13   US Abdomen Limited  Result Date: 07/18/2016 CLINICAL DATA:  Abdominal pain for 1 day.  Pancreatitis. EXAM: US ABDOMEN LIMITED - RIGHT UPPER QUADRANT COMPARISON:  None. FINDINGS: Gallbladder: No gallstones or wall thickening visualized. No sonographic Murphy sign noted by sonographer. Common bile duct: Diameter: 4.3 mm Liver: No focal lesion identified. Within normal limits in parenchymal echogenicity. IMPRESSION: 1. No cholelithiasis or sonographic evidence of acute cholecystitis. Electronically Signed   By: Elige Ko   On: 07/18/2016 13:41    TAT, DAVID, DO  [facility] Pager [phone]  If 7PM-7AM, please contact night-coverage [email or web address] Password [email or web address] 07/21/2016, 4:11 PM   LOS: 3 days

## 2016-07-21 NOTE — Discharge Summary (Signed)
Physician Discharge Summary  Robin Brady RUE:[id number] DOB: 1946-05-02 DOA: 07/18/2016  PCP: Lilia Argue  Admit date: 07/18/2016 Discharge date: 07/22/16  Admitted From: Home Disposition:  Home  Recommendations for Outpatient Follow-up:  1. Follow up with PCP in 1-2 weeks 2. Please obtain BMP/CBC in one week   Home Health:No Equipment/Devices:none  Discharge Condition: Stable CODE STATUS:FULL  Diet recommendation: Heart Healthy   Brief/Interim Summary: 71 year old female with a history of diabetes mellitus, hypertension, hyperlipidemia presenting with one-day history of abdominal pain in the epigastric and periumbilical area that began abruptly on the evening of 07/17/2016. The patient denies any new medications, alcohol, or illicit drugs. He denies any fevers, chills, chest pain, short of breath, dysuria, hematuria. She had numerous episodes of emesis without blood. Upon presentation, she was noted to have normal LFTs with lipase 2139. CT of the abdomen and pelvis revealed mild peripancreatic infiltrative changes and additional edema along the left anterior pararenal fascia and adjacent duodenum consistent with pancreatitis.   She was placed on bowel rest and IVF with clinical improvement.  Discharge Diagnoses:  Acute pancreatitis -07/18/2016 RUQ US--negative for cholelithiasis or gallbladder wall thickening -Triglycerides 82 -Etiology unclear, but clinically improving -Advance to soft diet which the patient tolerated -Pain control with IV morphine -Lipase 2139-->291-->21  Hypertension -Controlled -Continue metoprolol succinate  Diabetes mellitus type 2 -NovoLog sliding scale -Hemoglobin A1c--5.6 -resume metformin after discharge  Leukocytosis -likely stress demargination -Check UA --no pyuria -CXR--bibasilar atelectasis -improved  Hyperlipidemia -Continue Crestor and lovaza  Constipation -continue miralax -added bisacodyl supp  Discharge  Instructions  Discharge Instructions    Diet - low sodium heart healthy    Complete by:  As directed    Increase activity slowly    Complete by:  As directed      Allergies as of 07/22/2016      Reactions   Penicillins Hives   As a child. Tolerates cephalosporins. Has patient had a PCN reaction causing immediate rash, facial/tongue/throat swelling, SOB or lightheadedness with hypotension: No Has patient had a PCN reaction causing severe rash involving mucus membranes or skin necrosis: No Has patient had a PCN reaction that required hospitalization No Has patient had a PCN reaction occurring within the last 10 years: No If all of the above answers are "NO", then may proceed with Cephalosporin use.   Statins Other (See Comments)   Muscle cramps   Sulfa Antibiotics Swelling   angioedema   Tussionex Pennkinetic Er [hydrocod Polst-cpm Polst Er]    "thought she was going to die" Cold sweat Bradycardia Tolerates hydrocodone   Latex Rash   Local swelling      Medication List    STOP taking these medications   metoprolol tartrate 25 MG tablet Commonly known as:  LOPRESSOR     TAKE these medications   aspirin EC 81 MG tablet Take 81 mg by mouth every Monday, Wednesday, and Friday.   Calcium Carbonate-Vitamin D 600-400 MG-UNIT tablet Take 1 tablet by mouth daily.   ELOCON 0.1 % cream Generic drug:  mometasone Apply 1 application topically daily as needed (rash).   metFORMIN 500 MG 24 hr tablet Commonly known as:  GLUCOPHAGE-XR Take 500 mg by mouth daily with breakfast.   metoprolol succinate 25 MG 24 hr tablet Commonly known as:  TOPROL-XL Take 25 mg by mouth daily.   metroNIDAZOLE 0.75 % gel Commonly known as:  METROGEL Apply 1 application topically daily.   Omega 3 1000 MG Caps Take 3,000 mg by mouth 2 (  two) times daily with a meal.   OVER THE COUNTER MEDICATION Take 1 tablet by mouth daily. Bio repair- multivitamin   OVER THE COUNTER MEDICATION Take 1 scoop  by mouth daily. All day energy greens, powder   PRED FORTE OP Place 1 drop into both eyes daily.   rosuvastatin 5 MG tablet Commonly known as:  CRESTOR Take 1 tablet by mouth every other day or as directed What changed:  how much to take  how to take this  when to take this  additional instructions   vitamin B-12 1000 MCG tablet Commonly known as:  CYANOCOBALAMIN Take 1,000 mcg by mouth daily.   Vitamin D 2000 units Caps Take 2,000 Units by mouth daily.       Allergies  Allergen Reactions  . Penicillins Hives    As a child. Tolerates cephalosporins. Has patient had a PCN reaction causing immediate rash, facial/tongue/throat swelling, SOB or lightheadedness with hypotension: No Has patient had a PCN reaction causing severe rash involving mucus membranes or skin necrosis: No Has patient had a PCN reaction that required hospitalization No Has patient had a PCN reaction occurring within the last 10 years: No If all of the above answers are "NO", then may proceed with Cephalosporin use.  . Statins Other (See Comments)    Muscle cramps  . Sulfa Antibiotics Swelling    angioedema  . Tussionex Pennkinetic Er [Hydrocod Polst-Cpm Polst Er]     "thought she was going to die" Cold sweat Bradycardia Tolerates hydrocodone  . Latex Rash    Local swelling    Consultations:  none   Procedures/Studies: Dg Chest 2 View  Result Date: 07/20/2016 CLINICAL DATA:  Leukocytosis and low grade fever. EXAM: CHEST  2 VIEW COMPARISON:  07/23/2013 FINDINGS: Heart size is normal. Mediastinal shadows are normal except for some aortic calcification. There is patchy infiltrate and volume loss in both lower lobes. Upper lungs are clear. No evidence of heart failure or effusion. No acute bone finding. IMPRESSION: Bilateral lower lobe infiltrate/volume loss. No dense consolidation or lobar collapse. Electronically Signed   By: Paulina Fusi M.D.   On: 07/20/2016 14:48   Ct Abdomen Pelvis W  Contrast  Result Date: 07/18/2016 CLINICAL DATA:  Nausea, vomiting, abdominal pain and bloating since last night, elevated lipase, history diabetes mellitus, hyperlipidemia, hypertension, smoker EXAM: CT ABDOMEN AND PELVIS WITH CONTRAST TECHNIQUE: Multidetector CT imaging of the abdomen and pelvis was performed using the standard protocol following bolus administration of intravenous contrast. Sagittal and coronal MPR images reconstructed from axial data set. CONTRAST:  ISOVUE-300 IOPAMIDOL (ISOVUE-300) INJECTION 61% IV. No oral contrast administered. COMPARISON:  None FINDINGS: Lower chest: Bibasilar atelectasis Hepatobiliary: Gallbladder and liver normal appearance. No biliary dilatation. Pancreas: Mild peripancreatic infiltrative changes with additional edema along the LEFT anterior pararenal fascia and adjacent to the second portion of the duodenum most consistent with mild diffuse pancreatitis. Single parenchymal calcification at the pancreatic head. No pancreatic mass or ductal dilatation. Spleen: Normal appearance Adrenals/Urinary Tract: Tiny nonobstructing calculus at inferior pole LEFT kidney image 34. Adrenal glands, kidneys, ureters, and bladder otherwise normal appearance. Stomach/Bowel: Normal appendix coiled adjacent to cecal tip. Small hiatal hernia. Stomach and bowel loops otherwise normal in appearance for exam lacking GI contrast and adequate distention. No definite bowel dilatation or wall thickening. Vascular/Lymphatic: Atherosclerotic calcifications aorta without aneurysm. Few scattered pelvic phleboliths. No adenopathy. Reproductive: Unremarkable Other: No free air or free fluid.  No hernia. Musculoskeletal: Osseous demineralization. Old superior endplate compression fracture  L1 slightly increased since 07/23/2013. IMPRESSION: Mild diffuse pancreatitis changes. Small hiatal hernia. Single tiny nonobstructing LEFT renal calculus. Aortic atherosclerosis. OldL1 superior plate compression  fracture. Electronically Signed   By: Ulyses Southward M.D.   On: 07/18/2016 10:13   US Abdomen Limited  Result Date: 07/18/2016 CLINICAL DATA:  Abdominal pain for 1 day.  Pancreatitis. EXAM: US ABDOMEN LIMITED - RIGHT UPPER QUADRANT COMPARISON:  None. FINDINGS: Gallbladder: No gallstones or wall thickening visualized. No sonographic Murphy sign noted by sonographer. Common bile duct: Diameter: 4.3 mm Liver: No focal lesion identified. Within normal limits in parenchymal echogenicity. IMPRESSION: 1. No cholelithiasis or sonographic evidence of acute cholecystitis. Electronically Signed   By: Elige Ko   On: 07/18/2016 13:41        Discharge Exam: Vitals:   07/21/16 2029 07/22/16 0511  BP: 137/63 140/69  Pulse: 64 60  Resp: 16 14  Temp: 98 F (36.7 C) 98.9 F (37.2 C)   Vitals:   07/21/16 0541 07/21/16 1322 07/21/16 2029 07/22/16 0511  BP: (!) 150/65 136/68 137/63 140/69  Pulse: 61 61 64 60  Resp: 14 16 16 14   Temp: 98.5 F (36.9 C) 98.3 F (36.8 C) 98 F (36.7 C) 98.9 F (37.2 C)  TempSrc: Oral Oral Oral Oral  SpO2: 97% 98% 95% 95%  Weight:      Height:        General: Pt is alert, awake, not in acute distress Cardiovascular: RRR, S1/S2 +, no rubs, no gallops Respiratory: CTA bilaterally, no wheezing, no rhonchi Abdominal: Soft, mild periumbilical tender, ND, bowel sounds + Extremities: no edema, no cyanosis   The results of significant diagnostics from this hospitalization (including imaging, microbiology, ancillary and laboratory) are listed below for reference.    Significant Diagnostic Studies: Dg Chest 2 View  Result Date: 07/20/2016 CLINICAL DATA:  Leukocytosis and low grade fever. EXAM: CHEST  2 VIEW COMPARISON:  07/23/2013 FINDINGS: Heart size is normal. Mediastinal shadows are normal except for some aortic calcification. There is patchy infiltrate and volume loss in both lower lobes. Upper lungs are clear. No evidence of heart failure or effusion. No acute bone  finding. IMPRESSION: Bilateral lower lobe infiltrate/volume loss. No dense consolidation or lobar collapse. Electronically Signed   By: Paulina Fusi M.D.   On: 07/20/2016 14:48   Ct Abdomen Pelvis W Contrast  Result Date: 07/18/2016 CLINICAL DATA:  Nausea, vomiting, abdominal pain and bloating since last night, elevated lipase, history diabetes mellitus, hyperlipidemia, hypertension, smoker EXAM: CT ABDOMEN AND PELVIS WITH CONTRAST TECHNIQUE: Multidetector CT imaging of the abdomen and pelvis was performed using the standard protocol following bolus administration of intravenous contrast. Sagittal and coronal MPR images reconstructed from axial data set. CONTRAST:  ISOVUE-300 IOPAMIDOL (ISOVUE-300) INJECTION 61% IV. No oral contrast administered. COMPARISON:  None FINDINGS: Lower chest: Bibasilar atelectasis Hepatobiliary: Gallbladder and liver normal appearance. No biliary dilatation. Pancreas: Mild peripancreatic infiltrative changes with additional edema along the LEFT anterior pararenal fascia and adjacent to the second portion of the duodenum most consistent with mild diffuse pancreatitis. Single parenchymal calcification at the pancreatic head. No pancreatic mass or ductal dilatation. Spleen: Normal appearance Adrenals/Urinary Tract: Tiny nonobstructing calculus at inferior pole LEFT kidney image 34. Adrenal glands, kidneys, ureters, and bladder otherwise normal appearance. Stomach/Bowel: Normal appendix coiled adjacent to cecal tip. Small hiatal hernia. Stomach and bowel loops otherwise normal in appearance for exam lacking GI contrast and adequate distention. No definite bowel dilatation or wall thickening. Vascular/Lymphatic: Atherosclerotic calcifications aorta  without aneurysm. Few scattered pelvic phleboliths. No adenopathy. Reproductive: Unremarkable Other: No free air or free fluid.  No hernia. Musculoskeletal: Osseous demineralization. Old superior endplate compression fracture L1 slightly  increased since 07/23/2013. IMPRESSION: Mild diffuse pancreatitis changes. Small hiatal hernia. Single tiny nonobstructing LEFT renal calculus. Aortic atherosclerosis. OldL1 superior plate compression fracture. Electronically Signed   By: Ulyses SouthwardMark  Boles M.D.   On: 07/18/2016 10:13   [place] Abdomen Limited  Result Date: 07/18/2016 CLINICAL DATA:  Abdominal pain for 1 day.  Pancreatitis. EXAM: US ABDOMEN LIMITED - RIGHT UPPER QUADRANT COMPARISON:  None. FINDINGS: Gallbladder: No gallstones or wall thickening visualized. No sonographic Murphy sign noted by sonographer. Common bile duct: Diameter: 4.3 mm Liver: No focal lesion identified. Within normal limits in parenchymal echogenicity. IMPRESSION: 1. No cholelithiasis or sonographic evidence of acute cholecystitis. Electronically Signed   By: Elige KoHetal  Patel   On: 07/18/2016 13:41     Microbiology: Recent Results (from the past 240 hour(s))  Urine culture     Status: Abnormal   Collection Time: 07/19/16 11:29 AM  Result Value Ref Range Status   Specimen Description URINE, CLEAN CATCH  Final   Special Requests NONE  Final   Culture (A)  Final    <10,000 COLONIES/mL INSIGNIFICANT GROWTH Performed at [facility], [address] [place], [place] [address]    Report Status 07/20/2016 FINAL  Final     Labs: Basic Metabolic Panel:  Recent Labs Lab 07/18/16 0627 07/18/16 1610 07/19/16 0406 07/20/16 0437 07/21/16 0341  NA 140  --  138 136 138  K 4.3  --  4.2 3.7 3.9  CL 105  --  105 105 107  CO2 27  --  24 25 24   GLUCOSE 158*  --  128* 149* 100*  BUN 26*  --  16 10 8   CREATININE 0.90 0.99 0.95 0.85 0.79  CALCIUM 9.8  --  9.0 8.7* 8.7*   Liver Function Tests:  Recent Labs Lab 07/18/16 0627 07/19/16 0406  AST 24 19  ALT 16 12*  ALKPHOS 48 43  BILITOT 0.6 0.5  PROT 7.5 6.4*  ALBUMIN 4.4 3.6    Recent Labs Lab 07/18/16 0627 07/19/16 0406 07/21/16 0341  LIPASE 2,139* 291* 21   No results for input(s): AMMONIA in the last  168 hours. CBC:  Recent Labs Lab 07/18/16 1610 07/19/16 0406 07/20/16 0437 07/21/16 0341 07/22/16 0358  WBC 15.9* 16.9* 17.3* 13.5* 12.7*  NEUTROABS  --   --   --  9.5*  --   HGB 13.1 12.4 11.7* 11.8* 11.4*  HCT 38.6 37.0 34.6* 34.8* 33.2*  MCV 96.7 97.1 96.6 96.4 94.9  PLT 214 212 197 199 213   Cardiac Enzymes: No results for input(s): CKTOTAL, CKMB, CKMBINDEX, TROPONINI in the last 168 hours. BNP: Invalid input(s): POCBNP CBG: No results for input(s): GLUCAP in the last 168 hours.  Time coordinating discharge:  Greater than 30 minutes  Signed:  TAT, DAVID, DO [facility] Pager: [phone] 07/22/2016, 10:53 AM

## 2016-07-22 LAB — CBC
HCT: 33.2 % — ABNORMAL LOW (ref 36.0–46.0)
Hemoglobin: 11.4 g/dL — ABNORMAL LOW (ref 12.0–15.0)
MCH: 32.6 pg (ref 26.0–34.0)
MCHC: 34.3 g/dL (ref 30.0–36.0)
MCV: 94.9 fL (ref 78.0–100.0)
Platelets: 213 10*3/uL (ref 150–400)
RBC: 3.5 MIL/uL — ABNORMAL LOW (ref 3.87–5.11)
RDW: 13.1 % (ref 11.5–15.5)
WBC: 12.7 10*3/uL — ABNORMAL HIGH (ref 4.0–10.5)

## 2016-07-22 MED ORDER — BISACODYL 10 MG RE SUPP
10.0000 mg | Freq: Once | RECTAL | Status: AC
  Administered 2016-07-22: 10 mg via RECTAL
  Filled 2016-07-22: qty 1

## 2016-07-22 NOTE — Progress Notes (Signed)
Patient discharged to home with family, discharge instructions reviewed with patient who verbalized understanding. No new RX for patient. 

## 2016-12-19 ENCOUNTER — Ambulatory Visit (INDEPENDENT_AMBULATORY_CARE_PROVIDER_SITE_OTHER): Payer: Medicare Other | Admitting: Cardiovascular Disease

## 2016-12-19 ENCOUNTER — Encounter: Payer: Self-pay | Admitting: Cardiovascular Disease

## 2016-12-19 VITALS — BP 132/70 | HR 63 | Ht 64.0 in | Wt 157.6 lb

## 2016-12-19 DIAGNOSIS — R002 Palpitations: Secondary | ICD-10-CM

## 2016-12-19 DIAGNOSIS — E7800 Pure hypercholesterolemia, unspecified: Secondary | ICD-10-CM

## 2016-12-19 DIAGNOSIS — I10 Essential (primary) hypertension: Secondary | ICD-10-CM | POA: Diagnosis not present
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
## 2016-12-19 NOTE — Assessment & Plan Note (Signed)
History of hyperlipidemia on Crestor and fish oil with recent lipid profile performed 07/19/16 revealing total cholesterol of 164, LDL 88 and HDL of 60.

## 2016-12-19 NOTE — Assessment & Plan Note (Signed)
History of essential hypertension blood pressure sitting 132/70. She is on metoprolol. Continue current meds at current dosing

## 2016-12-19 NOTE — Patient Instructions (Signed)

## 2016-12-19 NOTE — Assessment & Plan Note (Signed)
Ms. Robin Brady continues to have occasional more noticeable nocturnal palpitations on beta-blockade.

## 2016-12-19 NOTE — Assessment & Plan Note (Signed)
No further episodes since I saw her last

## 2017-04-04 ENCOUNTER — Encounter (HOSPITAL_COMMUNITY): Payer: Self-pay | Admitting: Emergency Medicine

## 2017-04-04 ENCOUNTER — Inpatient Hospital Stay (HOSPITAL_COMMUNITY)
Admission: EM | Admit: 2017-04-04 | Discharge: 2017-04-10 | DRG: 439 | Disposition: A | Discharge status: Home / Self Care | Payer: Medicare Other | Attending: Family Medicine | Admitting: Family Medicine

## 2017-04-04 DIAGNOSIS — Z9882 Breast implant status: Secondary | ICD-10-CM

## 2017-04-04 DIAGNOSIS — Z880 Allergy status to penicillin: Secondary | ICD-10-CM

## 2017-04-04 DIAGNOSIS — E8770 Fluid overload, unspecified: Secondary | ICD-10-CM | POA: Diagnosis present

## 2017-04-04 DIAGNOSIS — E119 Type 2 diabetes mellitus without complications: Secondary | ICD-10-CM | POA: Diagnosis present

## 2017-04-04 DIAGNOSIS — E785 Hyperlipidemia, unspecified: Secondary | ICD-10-CM | POA: Diagnosis present

## 2017-04-04 DIAGNOSIS — F17210 Nicotine dependence, cigarettes, uncomplicated: Secondary | ICD-10-CM | POA: Diagnosis present

## 2017-04-04 DIAGNOSIS — Z7982 Long term (current) use of aspirin: Secondary | ICD-10-CM

## 2017-04-04 DIAGNOSIS — Z7984 Long term (current) use of oral hypoglycemic drugs: Secondary | ICD-10-CM

## 2017-04-04 DIAGNOSIS — D72829 Elevated white blood cell count, unspecified: Secondary | ICD-10-CM | POA: Diagnosis present

## 2017-04-04 DIAGNOSIS — Z79899 Other long term (current) drug therapy: Secondary | ICD-10-CM

## 2017-04-04 DIAGNOSIS — K8500 Idiopathic acute pancreatitis without necrosis or infection: Principal | ICD-10-CM | POA: Diagnosis present

## 2017-04-04 DIAGNOSIS — Z888 Allergy status to other drugs, medicaments and biological substances status: Secondary | ICD-10-CM

## 2017-04-04 DIAGNOSIS — Z882 Allergy status to sulfonamides status: Secondary | ICD-10-CM

## 2017-04-04 DIAGNOSIS — R109 Unspecified abdominal pain: Secondary | ICD-10-CM | POA: Diagnosis not present

## 2017-04-04 DIAGNOSIS — Z91040 Latex allergy status: Secondary | ICD-10-CM

## 2017-04-04 DIAGNOSIS — E876 Hypokalemia: Secondary | ICD-10-CM | POA: Diagnosis not present

## 2017-04-04 DIAGNOSIS — R188 Other ascites: Secondary | ICD-10-CM | POA: Diagnosis present

## 2017-04-04 DIAGNOSIS — R0602 Shortness of breath: Secondary | ICD-10-CM

## 2017-04-04 DIAGNOSIS — Z947 Corneal transplant status: Secondary | ICD-10-CM

## 2017-04-04 DIAGNOSIS — I10 Essential (primary) hypertension: Secondary | ICD-10-CM | POA: Diagnosis present

## 2017-04-04 DIAGNOSIS — Z833 Family history of diabetes mellitus: Secondary | ICD-10-CM

## 2017-04-04 DIAGNOSIS — K859 Acute pancreatitis without necrosis or infection, unspecified: Secondary | ICD-10-CM | POA: Diagnosis present

## 2017-04-04 DIAGNOSIS — Z8249 Family history of ischemic heart disease and other diseases of the circulatory system: Secondary | ICD-10-CM

## 2017-04-04 DIAGNOSIS — N179 Acute kidney failure, unspecified: Secondary | ICD-10-CM | POA: Diagnosis present

## 2017-04-04 DIAGNOSIS — T502X5A Adverse effect of carbonic-anhydrase inhibitors, benzothiadiazides and other diuretics, initial encounter: Secondary | ICD-10-CM | POA: Diagnosis not present

## 2017-04-04 DIAGNOSIS — Z7952 Long term (current) use of systemic steroids: Secondary | ICD-10-CM

## 2017-04-04 HISTORY — DX: Acute pancreatitis without necrosis or infection, unspecified: K85.90

## 2017-04-04 LAB — COMPREHENSIVE METABOLIC PANEL
ALT: 14 U/L (ref 14–54)
AST: 27 U/L (ref 15–41)
Albumin: 4.2 g/dL (ref 3.5–5.0)
Alkaline Phosphatase: 49 U/L (ref 38–126)
Anion gap: 12 (ref 5–15)
BUN: 30 mg/dL — ABNORMAL HIGH (ref 6–20)
CO2: 22 mmol/L (ref 22–32)
Calcium: 10 mg/dL (ref 8.9–10.3)
Chloride: 106 mmol/L (ref 101–111)
Creatinine, Ser: 1.26 mg/dL — ABNORMAL HIGH (ref 0.44–1.00)
GFR calc Af Amer: 48 mL/min — ABNORMAL LOW (ref >60)
GFR calc non Af Amer: 42 mL/min — ABNORMAL LOW (ref >60)
Glucose, Bld: 165 mg/dL — ABNORMAL HIGH (ref 65–99)
Potassium: 4.4 mmol/L (ref 3.5–5.1)
Sodium: 140 mmol/L (ref 135–145)
Total Bilirubin: 0.5 mg/dL (ref 0.3–1.2)
Total Protein: 7.6 g/dL (ref 6.5–8.1)

## 2017-04-04 LAB — URINALYSIS, ROUTINE W REFLEX MICROSCOPIC
Bilirubin Urine: NEGATIVE
Glucose, UA: NEGATIVE mg/dL
Hgb urine dipstick: NEGATIVE
Ketones, ur: 5 mg/dL — AB
Nitrite: NEGATIVE
Protein, ur: NEGATIVE mg/dL
Specific Gravity, Urine: 1.018 (ref 1.005–1.030)
pH: 5 (ref 5.0–8.0)

## 2017-04-04 LAB — CBC
HCT: 41.1 % (ref 36.0–46.0)
Hemoglobin: 14.1 g/dL (ref 12.0–15.0)
MCH: 33.5 pg (ref 26.0–34.0)
MCHC: 34.3 g/dL (ref 30.0–36.0)
MCV: 97.6 fL (ref 78.0–100.0)
Platelets: 269 10*3/uL (ref 150–400)
RBC: 4.21 MIL/uL (ref 3.87–5.11)
RDW: 13.4 % (ref 11.5–15.5)
WBC: 22.3 10*3/uL — ABNORMAL HIGH (ref 4.0–10.5)

## 2017-04-04 LAB — CBG MONITORING, ED: Glucose-Capillary: 171 mg/dL — ABNORMAL HIGH (ref 65–99)

## 2017-04-04 MED ORDER — SODIUM CHLORIDE 0.9 % IV BOLUS (SEPSIS)
1000.0000 mL | Freq: Once | INTRAVENOUS | Status: AC
  Administered 2017-04-04: 1000 mL via INTRAVENOUS

## 2017-04-04 MED ORDER — FENTANYL CITRATE (PF) 100 MCG/2ML IJ SOLN
50.0000 ug | Freq: Once | INTRAMUSCULAR | Status: AC
  Administered 2017-04-04: 50 ug via INTRAVENOUS
  Filled 2017-04-04: qty 2

## 2017-04-04 MED ORDER — ONDANSETRON HCL 4 MG/2ML IJ SOLN
4.0000 mg | Freq: Once | INTRAMUSCULAR | Status: AC
  Administered 2017-04-04: 4 mg via INTRAVENOUS
  Filled 2017-04-04: qty 2

## 2017-04-04 NOTE — ED Notes (Signed)
cbg 171 

## 2017-04-04 NOTE — ED Notes (Addendum)
Patient is diaphoretic and moaning in pain/acuity increased to 2

## 2017-04-04 NOTE — ED Triage Notes (Signed)
Pt is c/o abd pain with nausea and vomiting that started about 1930  Pt states she feels bloated and cramping   Pt has active vomiting in triage  Pt states she has hx of pancreatitis in Jan and this feels the same  Pt is pale in triage

## 2017-04-05 ENCOUNTER — Emergency Department (HOSPITAL_COMMUNITY): Payer: Medicare Other

## 2017-04-05 ENCOUNTER — Encounter (HOSPITAL_COMMUNITY): Payer: Self-pay

## 2017-04-05 DIAGNOSIS — K8500 Idiopathic acute pancreatitis without necrosis or infection: Secondary | ICD-10-CM | POA: Diagnosis present

## 2017-04-05 DIAGNOSIS — E8770 Fluid overload, unspecified: Secondary | ICD-10-CM | POA: Diagnosis present

## 2017-04-05 DIAGNOSIS — Z8249 Family history of ischemic heart disease and other diseases of the circulatory system: Secondary | ICD-10-CM | POA: Diagnosis not present

## 2017-04-05 DIAGNOSIS — Z882 Allergy status to sulfonamides status: Secondary | ICD-10-CM | POA: Diagnosis not present

## 2017-04-05 DIAGNOSIS — R188 Other ascites: Secondary | ICD-10-CM | POA: Diagnosis present

## 2017-04-05 DIAGNOSIS — Z91040 Latex allergy status: Secondary | ICD-10-CM | POA: Diagnosis not present

## 2017-04-05 DIAGNOSIS — Z947 Corneal transplant status: Secondary | ICD-10-CM | POA: Diagnosis not present

## 2017-04-05 DIAGNOSIS — Z7984 Long term (current) use of oral hypoglycemic drugs: Secondary | ICD-10-CM | POA: Diagnosis not present

## 2017-04-05 DIAGNOSIS — Z888 Allergy status to other drugs, medicaments and biological substances status: Secondary | ICD-10-CM | POA: Diagnosis not present

## 2017-04-05 DIAGNOSIS — T502X5A Adverse effect of carbonic-anhydrase inhibitors, benzothiadiazides and other diuretics, initial encounter: Secondary | ICD-10-CM | POA: Diagnosis not present

## 2017-04-05 DIAGNOSIS — Z833 Family history of diabetes mellitus: Secondary | ICD-10-CM | POA: Diagnosis not present

## 2017-04-05 DIAGNOSIS — E8779 Other fluid overload: Secondary | ICD-10-CM | POA: Diagnosis not present

## 2017-04-05 DIAGNOSIS — Z7982 Long term (current) use of aspirin: Secondary | ICD-10-CM | POA: Diagnosis not present

## 2017-04-05 DIAGNOSIS — E119 Type 2 diabetes mellitus without complications: Secondary | ICD-10-CM | POA: Diagnosis present

## 2017-04-05 DIAGNOSIS — Z9882 Breast implant status: Secondary | ICD-10-CM | POA: Diagnosis not present

## 2017-04-05 DIAGNOSIS — Z79899 Other long term (current) drug therapy: Secondary | ICD-10-CM | POA: Diagnosis not present

## 2017-04-05 DIAGNOSIS — E785 Hyperlipidemia, unspecified: Secondary | ICD-10-CM | POA: Diagnosis present

## 2017-04-05 DIAGNOSIS — D72829 Elevated white blood cell count, unspecified: Secondary | ICD-10-CM | POA: Diagnosis not present

## 2017-04-05 DIAGNOSIS — I10 Essential (primary) hypertension: Secondary | ICD-10-CM

## 2017-04-05 DIAGNOSIS — Z7952 Long term (current) use of systemic steroids: Secondary | ICD-10-CM | POA: Diagnosis not present

## 2017-04-05 DIAGNOSIS — N179 Acute kidney failure, unspecified: Secondary | ICD-10-CM | POA: Diagnosis present

## 2017-04-05 DIAGNOSIS — R109 Unspecified abdominal pain: Secondary | ICD-10-CM | POA: Diagnosis present

## 2017-04-05 DIAGNOSIS — F17210 Nicotine dependence, cigarettes, uncomplicated: Secondary | ICD-10-CM | POA: Diagnosis present

## 2017-04-05 DIAGNOSIS — Z880 Allergy status to penicillin: Secondary | ICD-10-CM | POA: Diagnosis not present

## 2017-04-05 DIAGNOSIS — E876 Hypokalemia: Secondary | ICD-10-CM | POA: Diagnosis not present

## 2017-04-05 LAB — LIPID PANEL
Cholesterol: 181 mg/dL (ref 0–200)
HDL: 55 mg/dL (ref >40)
LDL Cholesterol: 110 mg/dL — ABNORMAL HIGH (ref 0–99)
Total CHOL/HDL Ratio: 3.3 RATIO
Triglycerides: 82 mg/dL (ref <150)
VLDL: 16 mg/dL (ref 0–40)

## 2017-04-05 LAB — I-STAT TROPONIN, ED: Troponin i, poc: 0 ng/mL (ref 0.00–0.08)

## 2017-04-05 LAB — CBC
HCT: 39 % (ref 36.0–46.0)
Hemoglobin: 13.1 g/dL (ref 12.0–15.0)
MCH: 32.9 pg (ref 26.0–34.0)
MCHC: 33.6 g/dL (ref 30.0–36.0)
MCV: 98 fL (ref 78.0–100.0)
Platelets: 216 10*3/uL (ref 150–400)
RBC: 3.98 MIL/uL (ref 3.87–5.11)
RDW: 13.2 % (ref 11.5–15.5)
WBC: 15.9 10*3/uL — ABNORMAL HIGH (ref 4.0–10.5)

## 2017-04-05 LAB — COMPREHENSIVE METABOLIC PANEL
ALT: 14 U/L (ref 14–54)
AST: 21 U/L (ref 15–41)
Albumin: 3.7 g/dL (ref 3.5–5.0)
Alkaline Phosphatase: 47 U/L (ref 38–126)
Anion gap: 11 (ref 5–15)
BUN: 27 mg/dL — ABNORMAL HIGH (ref 6–20)
CO2: 22 mmol/L (ref 22–32)
Calcium: 8.8 mg/dL — ABNORMAL LOW (ref 8.9–10.3)
Chloride: 111 mmol/L (ref 101–111)
Creatinine, Ser: 1.07 mg/dL — ABNORMAL HIGH (ref 0.44–1.00)
GFR calc Af Amer: 59 mL/min — ABNORMAL LOW (ref >60)
GFR calc non Af Amer: 51 mL/min — ABNORMAL LOW (ref >60)
Glucose, Bld: 163 mg/dL — ABNORMAL HIGH (ref 65–99)
Potassium: 4.2 mmol/L (ref 3.5–5.1)
Sodium: 144 mmol/L (ref 135–145)
Total Bilirubin: 0.6 mg/dL (ref 0.3–1.2)
Total Protein: 6.5 g/dL (ref 6.5–8.1)

## 2017-04-05 LAB — GLUCOSE, CAPILLARY
Glucose-Capillary: 112 mg/dL — ABNORMAL HIGH (ref 65–99)
Glucose-Capillary: 114 mg/dL — ABNORMAL HIGH (ref 65–99)
Glucose-Capillary: 123 mg/dL — ABNORMAL HIGH (ref 65–99)
Glucose-Capillary: 141 mg/dL — ABNORMAL HIGH (ref 65–99)
Glucose-Capillary: 144 mg/dL — ABNORMAL HIGH (ref 65–99)

## 2017-04-05 LAB — CG4 I-STAT (LACTIC ACID): Lactic Acid, Venous: 1.82 mmol/L (ref 0.5–1.9)

## 2017-04-05 LAB — CBG MONITORING, ED: Glucose-Capillary: 151 mg/dL — ABNORMAL HIGH (ref 65–99)

## 2017-04-05 LAB — I-STAT CG4 LACTIC ACID, ED: Lactic Acid, Venous: 2.44 mmol/L (ref 0.5–1.9)

## 2017-04-05 LAB — LIPASE, BLOOD: Lipase: 323 U/L — ABNORMAL HIGH (ref 11–51)

## 2017-04-05 MED ORDER — PROMETHAZINE HCL 25 MG/ML IJ SOLN
6.2500 mg | Freq: Once | INTRAMUSCULAR | Status: AC
  Administered 2017-04-05: 6.25 mg via INTRAVENOUS
  Filled 2017-04-05: qty 1

## 2017-04-05 MED ORDER — INSULIN ASPART 100 UNIT/ML ~~LOC~~ SOLN
0.0000 [IU] | SUBCUTANEOUS | Status: DC
  Administered 2017-04-05 – 2017-04-07 (×4): 1 [IU] via SUBCUTANEOUS
  Administered 2017-04-08: 2 [IU] via SUBCUTANEOUS
  Administered 2017-04-08: 1 [IU] via SUBCUTANEOUS
  Administered 2017-04-09: 2 [IU] via SUBCUTANEOUS

## 2017-04-05 MED ORDER — IOPAMIDOL (ISOVUE-300) INJECTION 61%
INTRAVENOUS | Status: AC
  Administered 2017-04-05: 80 mL via INTRAVENOUS
  Filled 2017-04-05: qty 100

## 2017-04-05 MED ORDER — PROMETHAZINE HCL 25 MG/ML IJ SOLN: 6.2500 mg | Freq: Four times a day (QID) | INTRAMUSCULAR | Status: DC | PRN

## 2017-04-05 MED ORDER — ACETAMINOPHEN 650 MG RE SUPP: 650.0000 mg | Freq: Four times a day (QID) | RECTAL | Status: DC | PRN

## 2017-04-05 MED ORDER — HYOSCYAMINE SULFATE 0.5 MG/ML IJ SOLN
0.1250 mg | Freq: Once | INTRAMUSCULAR | Status: AC
  Administered 2017-04-05: 0.125 mg via INTRAVENOUS
  Filled 2017-04-05: qty 0.25

## 2017-04-05 MED ORDER — SODIUM CHLORIDE 0.9 % IV SOLN
INTRAVENOUS | Status: DC
  Administered 2017-04-05 (×2): via INTRAVENOUS
  Administered 2017-04-05 (×2): 125 mL/h via INTRAVENOUS
  Administered 2017-04-05 – 2017-04-07 (×5): via INTRAVENOUS

## 2017-04-05 MED ORDER — FENTANYL CITRATE (PF) 100 MCG/2ML IJ SOLN
50.0000 ug | Freq: Once | INTRAMUSCULAR | Status: AC
  Administered 2017-04-05: 50 ug via INTRAVENOUS
  Filled 2017-04-05: qty 2

## 2017-04-05 MED ORDER — HYDROMORPHONE HCL 1 MG/ML IJ SOLN
0.5000 mg | INTRAMUSCULAR | Status: DC | PRN
  Administered 2017-04-05: 1 mg via INTRAVENOUS
  Filled 2017-04-05: qty 1

## 2017-04-05 MED ORDER — MOMETASONE FUROATE 0.1 % EX CREA: 1.0000 "application " | TOPICAL_CREAM | Freq: Every day | CUTANEOUS | Status: DC | PRN

## 2017-04-05 MED ORDER — HYDRALAZINE HCL 20 MG/ML IJ SOLN: 10.0000 mg | Freq: Four times a day (QID) | INTRAMUSCULAR | Status: DC | PRN

## 2017-04-05 MED ORDER — ONDANSETRON HCL 4 MG PO TABS: 4.0000 mg | ORAL_TABLET | Freq: Four times a day (QID) | ORAL | Status: DC | PRN

## 2017-04-05 MED ORDER — ASPIRIN EC 81 MG PO TBEC
81.0000 mg | DELAYED_RELEASE_TABLET | ORAL | Status: DC
Start: 2017-04-05 — End: 2017-04-10
  Administered 2017-04-05 – 2017-04-10 (×3): 81 mg via ORAL
  Filled 2017-04-05 (×3): qty 1

## 2017-04-05 MED ORDER — IOPAMIDOL (ISOVUE-300) INJECTION 61%
100.0000 mL | Freq: Once | INTRAVENOUS | Status: AC | PRN
  Administered 2017-04-05: 80 mL via INTRAVENOUS

## 2017-04-05 MED ORDER — PREDNISOLONE ACETATE 1 % OP SUSP
1.0000 [drp] | Freq: Every day | OPHTHALMIC | Status: DC
  Administered 2017-04-06 – 2017-04-10 (×5): 1 [drp] via OPHTHALMIC
  Filled 2017-04-05: qty 5

## 2017-04-05 MED ORDER — METRONIDAZOLE 0.75 % EX GEL
1.0000 "application " | Freq: Every day | CUTANEOUS | Status: DC
  Administered 2017-04-05 – 2017-04-09 (×5): 1 via TOPICAL
  Filled 2017-04-05: qty 45

## 2017-04-05 MED ORDER — SODIUM CHLORIDE 0.9 % IV SOLN
Freq: Once | INTRAVENOUS | Status: AC
  Administered 2017-04-05: 03:00:00 via INTRAVENOUS

## 2017-04-05 MED ORDER — ROSUVASTATIN CALCIUM 5 MG PO TABS: 5.0000 mg | ORAL_TABLET | ORAL | Status: DC

## 2017-04-05 MED ORDER — ACETAMINOPHEN 325 MG PO TABS: 650.0000 mg | ORAL_TABLET | Freq: Four times a day (QID) | ORAL | Status: DC | PRN

## 2017-04-05 MED ORDER — HYDROMORPHONE HCL 1 MG/ML IJ SOLN
0.5000 mg | INTRAMUSCULAR | Status: DC | PRN
  Administered 2017-04-05 – 2017-04-06 (×5): 1 mg via INTRAVENOUS
  Filled 2017-04-05 (×5): qty 1

## 2017-04-05 MED ORDER — ONDANSETRON HCL 4 MG/2ML IJ SOLN
4.0000 mg | Freq: Four times a day (QID) | INTRAMUSCULAR | Status: DC | PRN
  Administered 2017-04-05 (×4): 4 mg via INTRAVENOUS
  Filled 2017-04-05 (×4): qty 2

## 2017-04-05 MED ORDER — ENOXAPARIN SODIUM 40 MG/0.4ML ~~LOC~~ SOLN
40.0000 mg | SUBCUTANEOUS | Status: DC
  Administered 2017-04-05 – 2017-04-10 (×6): 40 mg via SUBCUTANEOUS
  Filled 2017-04-05 (×6): qty 0.4

## 2017-04-05 MED ORDER — ONDANSETRON HCL 4 MG/2ML IJ SOLN
4.0000 mg | Freq: Once | INTRAMUSCULAR | Status: AC
  Administered 2017-04-05: 4 mg via INTRAVENOUS
  Filled 2017-04-05: qty 2
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
## 2017-04-05 NOTE — H&P (Addendum)
History and Physical    Robin Brady:[id number] DOB: 03/16/1946 DOA: 04/04/2017  PCP: Aletha Halim., PA-C  Patient coming from: Home  I have personally briefly reviewed patient's old medical records in [facility]  Chief Complaint: Abd pain, N/V  HPI: Robin Brady is a 71 y.o. female with medical history significant of DM2, HTN, idiopathic acute pancreatitis in Jan of this year.  Interestingly enough the bout of pancreatitis in Jan occurred right after taking [place] of [place].  In Jan: [place] neg for gallbladder disease, stones, etc.  No drug causes, triglycerides NL, no EtOH.  Today presents to ED with sudden onset of severe abd pain, N/V.  Symptoms onset at 7:30 PM today after normal day today.  Vomiting since onset.  No diarrhea or fever.  Symptoms similar to prior pancreatitis earlier this year.  ED Course: Lipase 323. Creat 1.2 and BUN 30 up slightly from baseline.  Lactate 2.44.  CT shows severe acute pancreatitis with mild ascites.  No necrosis.   Review of Systems: As per HPI otherwise 10 point review of systems negative.   Past Medical History:  Diagnosis Date  . Anemia   . Constipation   . Diabetes ([place])   . Diabetes mellitus without complication ([place])   . Hyperlipemia   . Hypertension   . Loss of consciousness ([place])   . Pancreatitis   . Ptosis     Past Surgical History:  Procedure Laterality Date  . BREAST CYSTECTOMY Bilateral 5885   w/ Silicone implants  . BREAST IMPLANT REMOVAL Bilateral 1989   Replaced w/ saline implants  . CARDIOVASCULAR STRESS TEST  09/30/2008   No evidence of signifcant ischemia.   Marland Kitchen CAROTID ANGIOGRAM  06/03/2007   No evidence to suggest occlusion, stenosis, dissection, aneurysm, or dural AV fistulas on images provided. ophthalmic artierieshave normal originsw/ normal capillary blush.  Marland Kitchen CATARACT EXTRACTION Right 2006  . CORNEAL TRANSPLANT Right 1960, 1986, [place], & 2004  . PARS PLANA  VITRECTOMY W/ REPAIR OF MACULAR HOLE  2009  . PILONIDAL CYST EXCISION  1964  . TONSILLECTOMY  1948     reports that she has been smoking.  She has been smoking about 0.25 packs per day. She has never used smokeless tobacco. She reports that she does not drink alcohol or use drugs.  Allergies  Allergen Reactions  . Penicillins Hives    As a child. Tolerates cephalosporins. Has patient had a PCN reaction causing immediate rash, facial/tongue/throat swelling, SOB or lightheadedness with hypotension: No Has patient had a PCN reaction causing severe rash involving mucus membranes or skin necrosis: No Has patient had a PCN reaction that required hospitalization No Has patient had a PCN reaction occurring within the last 10 years: No If all of the above answers are "NO", then may proceed with Cephalosporin use.  . Statins Other (See Comments)    Muscle cramps  . Sulfa Antibiotics Swelling    angioedema  . Tussionex Pennkinetic Er [Hydrocod Polst-Cpm Polst Er]     "thought she was going to die" Cold sweat Bradycardia Tolerates hydrocodone  . Latex Rash    Local swelling    Family History  Problem Relation Age of Onset  . Multiple myeloma Mother   . Heart failure Father   . Diabetes Father   . Heart attack Father 49  . Diabetes Brother 14  . Hypertension Brother 56  . Diabetes Paternal Grandmother   . Crohn's disease  Brother 24  . Diabetes Maternal Grandfather   . Diabetes Brother 64  . Alcoholism Brother   . 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     Prior to Admission medications   Medication Sig Start Date End Date Taking? Authorizing Provider  aspirin EC 81 MG tablet Take 81 mg by mouth every Monday, Wednesday, and Friday.   Yes [provider]  Calcium Carbonate-Vitamin D 600-400 MG-UNIT tablet Take 1 tablet by mouth daily.   Yes [provider]  Cholecalciferol (VITAMIN D) 2000 UNITS CAPS Take 2,000  Units by mouth daily.    Yes [provider]  metFORMIN (GLUCOPHAGE-XR) 500 MG 24 hr tablet Take 500 mg by mouth daily with breakfast.   Yes [provider]  metoprolol succinate (TOPROL-XL) 25 MG 24 hr tablet Take 25 mg by mouth daily.   Yes [provider]  metroNIDAZOLE (METROGEL) 0.75 % gel Apply 1 application topically daily. 04/20/16  Yes [provider]  mometasone (ELOCON) 0.1 % cream Apply 1 application topically daily as needed (rash).    Yes [provider]  Omega 3 1000 MG CAPS Take 3,000 mg by mouth 2 (two) times daily with a meal.    Yes [provider]  OVER THE COUNTER MEDICATION Take 1 tablet by mouth daily. Bio repair- multivitamin   Yes [provider]  OVER THE COUNTER MEDICATION Take 1 scoop by mouth daily. All day energy greens, powder   Yes [provider]  polyethylene glycol (MIRALAX / GLYCOLAX) packet Take 17 g by mouth daily.   Yes [provider]  PrednisoLONE Acetate (PRED FORTE OP) Place 1 drop into both eyes daily.    Yes [provider]  rosuvastatin (CRESTOR) 5 MG tablet Take 1 tablet by mouth every other day or as directed Patient taking differently: Take 5 mg by mouth every Monday, Wednesday, and Friday.  01/17/15  Yes Lorretta Harp, MD  vitamin B-12 (CYANOCOBALAMIN) 1000 MCG tablet Take 1,000 mcg by mouth daily.   Yes [provider]    Physical Exam: Vitals:   04/05/17 0300 04/05/17 0315 04/05/17 0330 04/05/17 0345  BP: (!) 169/77 (!) 167/76 (!) 161/72 (!) 169/67  Pulse: 73 71 62 65  Resp: 18 (!) 23 (!) 22 (!) 21  Temp:      TempSrc:      SpO2: 92% 92% 92% 92%  Weight:        Constitutional: NAD, calm, comfortable Eyes: PERRL, lids and conjunctivae normal ENMT: Mucous membranes are moist. Posterior pharynx clear of any exudate or lesions.Normal dentition.  Neck: normal, supple, no masses, no thyromegaly Respiratory: clear to auscultation  bilaterally, no wheezing, no crackles. Normal respiratory effort. No accessory muscle use.  Cardiovascular: Regular rate and rhythm, no murmurs / rubs / gallops. No extremity edema. 2+ pedal pulses. No carotid bruits.  Abdomen: no tenderness, no masses palpated. No hepatosplenomegaly. Bowel sounds positive.  Musculoskeletal: no clubbing / cyanosis. No joint deformity upper and lower extremities. Good ROM, no contractures. Normal muscle tone.  Skin: no rashes, lesions, ulcers. No induration Neurologic: CN 2-12 grossly intact. Sensation intact, DTR normal. Strength 5/5 in all 4.  Psychiatric: Normal judgment and insight. Alert and oriented x 3. Normal mood.    Labs on Admission: I have personally reviewed following labs and imaging studies  CBC:  Recent Labs Lab 04/04/17 2244  WBC 22.3*  HGB 14.1  HCT  41.1  MCV 97.6  PLT 563   Basic Metabolic Panel:  Recent Labs Lab 04/04/17 2244  NA 140  K 4.4  CL 106  CO2 22  GLUCOSE 165*  BUN 30*  CREATININE 1.26*  CALCIUM 10.0   GFR: Estimated Creatinine Clearance: 39 mL/min (A) (by C-G formula based on SCr of 1.26 mg/dL (H)). Liver Function Tests:  Recent Labs Lab 04/04/17 2244  AST 27  ALT 14  ALKPHOS 49  BILITOT 0.5  PROT 7.6  ALBUMIN 4.2    Recent Labs Lab 04/04/17 2244  LIPASE 323*   No results for input(s): AMMONIA in the last 168 hours. Coagulation Profile: No results for input(s): INR, PROTIME in the last 168 hours. Cardiac Enzymes: No results for input(s): CKTOTAL, CKMB, CKMBINDEX, TROPONINI in the last 168 hours. BNP (last 3 results) No results for input(s): PROBNP in the last 8760 hours. HbA1C: No results for input(s): HGBA1C in the last 72 hours. CBG:  Recent Labs Lab 04/04/17 2345  GLUCAP 171*   Lipid Profile: No results for input(s): CHOL, HDL, LDLCALC, TRIG, CHOLHDL, LDLDIRECT in the last 72 hours. Thyroid Function Tests: No results for input(s): TSH, T4TOTAL, FREET4, T3FREE, THYROIDAB in the  last 72 hours. Anemia Panel: No results for input(s): VITAMINB12, FOLATE, FERRITIN, TIBC, IRON, RETICCTPCT in the last 72 hours. Urine analysis:    Component Value Date/Time   COLORURINE YELLOW 04/04/2017 2212   APPEARANCEUR HAZY (A) 04/04/2017 2212   LABSPEC 1.018 04/04/2017 2212   PHURINE 5.0 04/04/2017 2212   GLUCOSEU NEGATIVE 04/04/2017 2212   HGBUR NEGATIVE 04/04/2017 2212   BILIRUBINUR NEGATIVE 04/04/2017 2212   KETONESUR 5 (A) 04/04/2017 2212   PROTEINUR NEGATIVE 04/04/2017 2212   NITRITE NEGATIVE 04/04/2017 2212   LEUKOCYTESUR MODERATE (A) 04/04/2017 2212    Radiological Exams on Admission: Ct Abdomen Pelvis W Contrast  Result Date: 04/05/2017 CLINICAL DATA:  Abdominal pain, nausea, vomiting, bloating and cramps. Leukocytosis. Elevated lipase. History of hypertension, diabetes, pancreatitis. EXAM: CT ABDOMEN AND PELVIS WITH CONTRAST TECHNIQUE: Multidetector CT imaging of the abdomen and pelvis was performed using the standard protocol following bolus administration of intravenous contrast. CONTRAST:  100 cc Isovue-300 COMPARISON:  CT abdomen and pelvis July 18, 2016 FINDINGS: LOWER CHEST: Similar bilateral lower lobe atelectasis/scarring and minimal bronchiectasis. Scratch Included heart size is normal. No pericardial effusion. HEPATOBILIARY: Liver and gallbladder are normal. PANCREAS: Enlarged, diffusely edematous pancreas without focal necrosis. No pseudocyst, pancreatic duct dilatation. Stable calcification proximal pancreatic body. SPLEEN: Normal. ADRENALS/URINARY TRACT: Kidneys are orthotopic, demonstrating symmetric enhancement. 3 mm LEFT lower pole nephrolithiasis. No hydronephrosis or solid renal masses. The unopacified ureters are normal in course and caliber. Delayed imaging through the kidneys demonstrates symmetric prompt contrast excretion within the proximal urinary collecting system. Urinary bladder is partially distended and unremarkable. Normal adrenal glands.  STOMACH/BOWEL: Small hiatal hernia. The stomach, small and large bowel are normal in course and caliber without inflammatory changes. Small debris-filled duodenum diverticulum. Moderate amount of retained large bowel stool. Normal appendix. VASCULAR/LYMPHATIC: Aortoiliac vessels are normal in course and caliber. Severe calcific atherosclerosis. No lymphadenopathy by CT size criteria. REPRODUCTIVE: Normal. OTHER: Small amount of ascites and, retroperitoneal free fluid. No rim enhancing fluid collection. No intraperitoneal free air. MUSCULOSKELETAL: Nonacute. Bilateral breast implants. Osteopenia. Old moderate L1 compression fracture. Moderate L5-S1 degenerative disc. Small fat containing umbilical hernia. IMPRESSION: 1. Acute severe pancreatitis without necrosis. Small volume ascites and retroperitoneal free fluid, no abscess. 2. 3 mm nonobstructing LEFT nephrolithiasis. Aortic Atherosclerosis (ICD10-I70.0). Electronically Signed  By: Elon Alas M.D.   On: 04/05/2017 02:21    EKG: Independently reviewed.  Assessment/Plan Principal Problem:   Idiopathic acute pancreatitis Active Problems:   Diabetes mellitus without complication (HCC)   Essential hypertension   Leukocytosis    1. Idiopathic acute pancreatitis - 1. IVF: NS 1.5L bolus in ED, 125 cc/hr 2. Pain control with dilaudid PRN 3. Zofran and phenergan PRN nausea 4. NPO except ice chips / sips of water / meds 5. Leukocytosis, but no other SIRS, so will hold off on ABx for now 6. BCx pending 7. Repeat CBC and CMP in AM 8. Now 2nd bout this year: 1. No suspicious EtOH history 2. Gallbladder and duct not remarkable on CT scan 3. Triglycerides were nl (80) in Jan, will get this in AM but seems doubtful to be cause given that it wasn't in Jan. 4. No obvious meds that are known to be frequent causes (Class I or II agents) 5. No scorpion stings 6. 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 7. Probably deserves GI consult routinely in AM regarding  further work up at this point. 2. HTN - holding home metoprolol meds 3. DM2 - 1. Hold metformin 2. Sensitive SSI q4h  DVT prophylaxis: Lovenox Code Status: Full Family Communication: Husband at bedside Disposition Plan: Home after admit Consults called: None, consider GI consult in AM Admission status: Admit to inpatient - inpatient status for severe acute pancreatitis, patient having dry heaves even while NPO   [place], [facility] Pager [phone]  If 7AM-7PM, please contact day team taking care of patient [email or web address] Password [email or web address]  04/05/2017, 4:01 AM

## 2017-04-05 NOTE — Progress Notes (Signed)
[facility] PROGRESS NOTE  Robin Brady:[id number] DOB: February 16, 1946 DOA: 04/04/2017  PCP: Richmond Campbell., PA-C  Brief History/Interval Summary: 71 year old Caucasian female with a past medical history of diabetes, hypertension, idiopathic acute pancreatitis in January of 2018 , presented with complaints of abdominal pain, nausea and vomiting. She was found to have elevated lipase level. CT scan showed acute pancreatitis. She was hospitalized for further management.  Reason for Visit: Acute pancreatitis  Consultants: None  Procedures: None  Antibiotics: None  Subjective/Interval History: Patient states that she is feeling better. Currently, pain is 2 out of 10 in intensity, but she received pain medications just a few minutes ago. Some nausea but no vomiting. Not passing any gas. No diarrhea recently.  ROS: No chest pain or shortness of breath.  Objective:  Vital Signs  Vitals:   04/05/17 0315 04/05/17 0330 04/05/17 0345 04/05/17 0447  BP: (!) 167/76 (!) 161/72 (!) 169/67 (!) 153/69  Pulse: 71 62 65 (!) 57  Resp: (!) 23 (!) 22 (!) 21 20  Temp:    98.8 F (37.1 C)  TempSrc:    Oral  SpO2: 92% 92% 92% 93%  Weight:        Intake/Output Summary (Last 24 hours) at 04/05/17 1223 Last data filed at 04/05/17 1100  Gross per 24 hour  Intake            912.5 ml  Output                0 ml  Net            912.5 ml   Filed Weights   04/04/17 2056  Weight: 71 kg (156 lb 9.6 oz)    General appearance: alert, cooperative, appears stated age and no distress Resp: clear to auscultation bilaterally Cardio: regular rate and rhythm, S1, S2 normal, systolic murmur: early systolic 3/6, rumbling throughout the precordium and no rub GI: Abdomen is soft. Tender in the upper abdomen, mainly in the epigastric area without any rebound, rigidity or guarding. Bowel sounds absent. No masses or organomegaly. Mildly distended. Extremities: Right lower extremity noted to be  mildly larger compared to the left which is chronic per patient. Neurologic: No focal neurological deficits  Lab Results:  Data Reviewed: I have personally reviewed following labs and imaging studies  CBC:  Recent Labs Lab 04/04/17 2244 04/05/17 0451  WBC 22.3* 15.9*  HGB 14.1 13.1  HCT 41.1 39.0  MCV 97.6 98.0  PLT 269 216    Basic Metabolic Panel:  Recent Labs Lab 04/04/17 2244 04/05/17 0451  NA 140 144  K 4.4 4.2  CL 106 111  CO2 22 22  GLUCOSE 165* 163*  BUN 30* 27*  CREATININE 1.26* 1.07*  CALCIUM 10.0 8.8*    GFR: Estimated Creatinine Clearance: 45.9 mL/min (A) (by C-G formula based on SCr of 1.07 mg/dL (H)).  Liver Function Tests:  Recent Labs Lab 04/04/17 2244 04/05/17 0451  AST 27 21  ALT 14 14  ALKPHOS 49 47  BILITOT 0.5 0.6  PROT 7.6 6.5  ALBUMIN 4.2 3.7     Recent Labs Lab 04/04/17 2244  LIPASE 323*   CBG:  Recent Labs Lab 04/04/17 2345 04/05/17 0417 04/05/17 0728 04/05/17 1147  GLUCAP 171* 151* 144* 141*    Lipid Profile:  Recent Labs  04/05/17 0451  CHOL 181  HDL 55  LDLCALC 110*  TRIG 82  CHOLHDL 3.3     Radiology Studies: Ct Abdomen Pelvis W  Contrast  Result Date: 04/05/2017 CLINICAL DATA:  Abdominal pain, nausea, vomiting, bloating and cramps. Leukocytosis. Elevated lipase. History of hypertension, diabetes, pancreatitis. EXAM: CT ABDOMEN AND PELVIS WITH CONTRAST TECHNIQUE: Multidetector CT imaging of the abdomen and pelvis was performed using the standard protocol following bolus administration of intravenous contrast. CONTRAST:  100 cc Isovue-300 COMPARISON:  CT abdomen and pelvis July 18, 2016 FINDINGS: LOWER CHEST: Similar bilateral lower lobe atelectasis/scarring and minimal bronchiectasis. Scratch Included heart size is normal. No pericardial effusion. HEPATOBILIARY: Liver and gallbladder are normal. PANCREAS: Enlarged, diffusely edematous pancreas without focal necrosis. No pseudocyst, pancreatic duct  dilatation. Stable calcification proximal pancreatic body. SPLEEN: Normal. ADRENALS/URINARY TRACT: Kidneys are orthotopic, demonstrating symmetric enhancement. 3 mm LEFT lower pole nephrolithiasis. No hydronephrosis or solid renal masses. The unopacified ureters are normal in course and caliber. Delayed imaging through the kidneys demonstrates symmetric prompt contrast excretion within the proximal urinary collecting system. Urinary bladder is partially distended and unremarkable. Normal adrenal glands. STOMACH/BOWEL: Small hiatal hernia. The stomach, small and large bowel are normal in course and caliber without inflammatory changes. Small debris-filled duodenum diverticulum. Moderate amount of retained large bowel stool. Normal appendix. VASCULAR/LYMPHATIC: Aortoiliac vessels are normal in course and caliber. Severe calcific atherosclerosis. No lymphadenopathy by CT size criteria. REPRODUCTIVE: Normal. OTHER: Small amount of ascites and, retroperitoneal free fluid. No rim enhancing fluid collection. No intraperitoneal free air. MUSCULOSKELETAL: Nonacute. Bilateral breast implants. Osteopenia. Old moderate L1 compression fracture. Moderate L5-S1 degenerative disc. Small fat containing umbilical hernia. IMPRESSION: 1. Acute severe pancreatitis without necrosis. Small volume ascites and retroperitoneal free fluid, no abscess. 2. 3 mm nonobstructing LEFT nephrolithiasis. Aortic Atherosclerosis (ICD10-I70.0). Electronically Signed   By: Awilda Metro M.D.   On: 04/05/2017 02:21     Medications:  Scheduled: . aspirin EC  81 mg Oral Q M,W,F  . enoxaparin (LOVENOX) injection  40 mg Subcutaneous Q24H  . insulin aspart  0-9 Units Subcutaneous Q4H  . metroNIDAZOLE  1 application Topical Daily  . prednisoLONE acetate  1 drop Both Eyes Daily   Continuous: . sodium chloride 125 mL/hr (04/05/17 1214)   ZOX:[id number] **OR** acetaminophen, HYDROmorphone (DILAUDID) injection, mometasone, ondansetron **OR**  ondansetron (ZOFRAN) IV, promethazine  Assessment/Plan:  Principal Problem:   Idiopathic acute pancreatitis Active Problems:   Diabetes mellitus without complication (HCC)   Essential hypertension   Leukocytosis    Idiopathic acute pancreatitis Etiology unclear. She denies any alcohol intake. No gallstones detected on ultrasound done earlier this year. Triglyceride levels normal. She is on rosuvastatin, but has been on this for at least 3 years. We will hold it for now. Patient will need further workup. She has seen Dr. Matthias Hughs with [facility] gastroenterology previously for colonoscopy. She will need to see him again for further evaluation of acute pancreatitis. This can be done as an outpatient if she continues to improve. Continue supportive treatment. IV fluids. Keep her nothing by mouth for now. No evidence for necrosis on the CT scan.  History of essential hypertension Elevated blood pressure likely due to pain. Hydralazine as needed.  Diabetes mellitus type II Holding metformin. Continue sliding scale insulin coverage.  DVT Prophylaxis: Lovenox    Code Status: Full code  Family Communication: Discussed with the patient  Disposition Plan: Management as outlined above.    LOS: 0 days   [facility]  [facility] Pager [phone] 04/05/2017, 12:23 PM  If 7PM-7AM, please contact night-coverage at [email or web address], password [place]

## 2017-04-06 LAB — COMPREHENSIVE METABOLIC PANEL
ALT: 11 U/L — ABNORMAL LOW (ref 14–54)
AST: 24 U/L (ref 15–41)
Albumin: 3.1 g/dL — ABNORMAL LOW (ref 3.5–5.0)
Alkaline Phosphatase: 42 U/L (ref 38–126)
Anion gap: 8 (ref 5–15)
BUN: 20 mg/dL (ref 6–20)
CO2: 21 mmol/L — ABNORMAL LOW (ref 22–32)
Calcium: 8 mg/dL — ABNORMAL LOW (ref 8.9–10.3)
Chloride: 114 mmol/L — ABNORMAL HIGH (ref 101–111)
Creatinine, Ser: 0.94 mg/dL (ref 0.44–1.00)
GFR calc Af Amer: >60 mL/min (ref >60)
GFR calc non Af Amer: 59 mL/min — ABNORMAL LOW (ref >60)
Glucose, Bld: 118 mg/dL — ABNORMAL HIGH (ref 65–99)
Potassium: 4.1 mmol/L (ref 3.5–5.1)
Sodium: 143 mmol/L (ref 135–145)
Total Bilirubin: 0.6 mg/dL (ref 0.3–1.2)
Total Protein: 5.9 g/dL — ABNORMAL LOW (ref 6.5–8.1)

## 2017-04-06 LAB — GLUCOSE, CAPILLARY
Glucose-Capillary: 106 mg/dL — ABNORMAL HIGH (ref 65–99)
Glucose-Capillary: 107 mg/dL — ABNORMAL HIGH (ref 65–99)
Glucose-Capillary: 109 mg/dL — ABNORMAL HIGH (ref 65–99)
Glucose-Capillary: 118 mg/dL — ABNORMAL HIGH (ref 65–99)
Glucose-Capillary: 110 mg/dL — ABNORMAL HIGH (ref 65–99)

## 2017-04-06 LAB — CBC
HCT: 41.1 % (ref 36.0–46.0)
Hemoglobin: 13.5 g/dL (ref 12.0–15.0)
MCH: 32.9 pg (ref 26.0–34.0)
MCHC: 32.8 g/dL (ref 30.0–36.0)
MCV: 100.2 fL — ABNORMAL HIGH (ref 78.0–100.0)
Platelets: 182 10*3/uL (ref 150–400)
RBC: 4.1 MIL/uL (ref 3.87–5.11)
RDW: 14 % (ref 11.5–15.5)
WBC: 21.1 10*3/uL — ABNORMAL HIGH (ref 4.0–10.5)

## 2017-04-06 LAB — LIPASE, BLOOD: Lipase: 501 U/L — ABNORMAL HIGH (ref 11–51)

## 2017-04-06 MED ORDER — METOPROLOL SUCCINATE ER 25 MG PO TB24
25.0000 mg | ORAL_TABLET | Freq: Every day | ORAL | Status: DC
  Administered 2017-04-06 – 2017-04-10 (×5): 25 mg via ORAL
  Filled 2017-04-06 (×5): qty 1

## 2017-04-06 NOTE — Progress Notes (Signed)
[facility] PROGRESS NOTE  Robin Brady:[id number] DOB: 1945-08-15 DOA: 04/04/2017  PCP: Richmond Campbell., PA-C  Brief History/Interval Summary: 71 year old Caucasian female with a past medical history of diabetes, hypertension, idiopathic acute pancreatitis in January of 2018 , presented with complaints of abdominal pain, nausea and vomiting. She was found to have elevated lipase level. CT scan showed acute pancreatitis. She was hospitalized for further management.  Reason for Visit: Acute pancreatitis  Consultants: None  Procedures: None  Antibiotics: None  Subjective/Interval History: Pain is about 4 of 10 in intensity. Denies any vomiting. Few dry heaves at times. Overall, however, she does feel better. Still not passing any gas, though.   ROS: No chest pain or shortness of breath  Objective:  Vital Signs  Vitals:   04/05/17 1438 04/05/17 2144 04/06/17 0010 04/06/17 0537  BP: (!) 175/67 (!) 162/70  (!) 165/77  Pulse: 66 70  84  Resp: Temp: 98.4 F (36.9 C) 99.1 F (37.3 C)  99.3 F (37.4 C)  TempSrc: Oral Oral  Oral  SpO2: 98% 93%  94%  Weight:   69.8 kg (153 lb 14.1 oz)     Intake/Output Summary (Last 24 hours) at 04/06/17 0825 Last data filed at 04/05/17 2148  Gross per 24 hour  Intake           1287.5 ml  Output              900 ml  Net            387.5 ml   Filed Weights   04/04/17 2056 04/06/17 0010  Weight: 71 kg (156 lb 9.6 oz) 69.8 kg (153 lb 14.1 oz)    General appearance: Awake, alert. In no distress. Resp: Clear to auscultation bilaterally. No wheezing, rales or rhonchi. Cardio: S1, S2 is normal, regular. No S3, S4. No rubs, murmurs, bruits  GI: Abdomen is soft. Head remains tender mainly in the upper abdomen without any rebound, rigidity or guarding. Bowel sounds remain absent. No masses or organomegaly.  Extremities: No changes. No significant edema. Neurologic: No obvious focal neurological deficits.  Lab  Results:  Data Reviewed: I have personally reviewed following labs and imaging studies  CBC:  Recent Labs Lab 04/04/17 2244 04/05/17 0451 04/06/17 0406  WBC 22.3* 15.9* 21.1*  HGB 14.1 13.1 13.5  HCT 41.1 39.0 41.1  MCV 97.6 98.0 100.2*  PLT 269 216 182    Basic Metabolic Panel:  Recent Labs Lab 04/04/17 2244 04/05/17 0451 04/06/17 0406  NA 140 144 143  K 4.4 4.2 4.1  CL 106 111 114*  CO2 22 22 21*  GLUCOSE 165* 163* 118*  BUN 30* 27* 20  CREATININE 1.26* 1.07* 0.94  CALCIUM 10.0 8.8* 8.0*    GFR: Estimated Creatinine Clearance: 51.8 mL/min (by C-G formula based on SCr of 0.94 mg/dL).  Liver Function Tests:  Recent Labs Lab 04/04/17 2244 04/05/17 0451 04/06/17 0406  AST ALT 14 14 11*  ALKPHOS 49 47 42  BILITOT 0.5 0.6 0.6  PROT 7.6 6.5 5.9*  ALBUMIN 4.2 3.7 3.1*     Recent Labs Lab 04/04/17 2244 04/06/17 0406  LIPASE 323* 501*   CBG:  Recent Labs Lab 04/05/17 1627 04/05/17 1940 04/05/17 2342 04/06/17 0505 04/06/17 0744  GLUCAP 112* 123* 114* 106* 107*    Lipid Profile:  Recent Labs  04/05/17 0451  CHOL 181  HDL 55  LDLCALC 110*  TRIG  82  CHOLHDL 3.3     Radiology Studies: Ct Abdomen Pelvis W Contrast  Result Date: 04/05/2017 CLINICAL DATA:  Abdominal pain, nausea, vomiting, bloating and cramps. Leukocytosis. Elevated lipase. History of hypertension, diabetes, pancreatitis. EXAM: CT ABDOMEN AND PELVIS WITH CONTRAST TECHNIQUE: Multidetector CT imaging of the abdomen and pelvis was performed using the standard protocol following bolus administration of intravenous contrast. CONTRAST:  100 cc Isovue-300 COMPARISON:  CT abdomen and pelvis July 18, 2016 FINDINGS: LOWER CHEST: Similar bilateral lower lobe atelectasis/scarring and minimal bronchiectasis. Scratch Included heart size is normal. No pericardial effusion. HEPATOBILIARY: Liver and gallbladder are normal. PANCREAS: Enlarged, diffusely edematous pancreas without  focal necrosis. No pseudocyst, pancreatic duct dilatation. Stable calcification proximal pancreatic body. SPLEEN: Normal. ADRENALS/URINARY TRACT: Kidneys are orthotopic, demonstrating symmetric enhancement. 3 mm LEFT lower pole nephrolithiasis. No hydronephrosis or solid renal masses. The unopacified ureters are normal in course and caliber. Delayed imaging through the kidneys demonstrates symmetric prompt contrast excretion within the proximal urinary collecting system. Urinary bladder is partially distended and unremarkable. Normal adrenal glands. STOMACH/BOWEL: Small hiatal hernia. The stomach, small and large bowel are normal in course and caliber without inflammatory changes. Small debris-filled duodenum diverticulum. Moderate amount of retained large bowel stool. Normal appendix. VASCULAR/LYMPHATIC: Aortoiliac vessels are normal in course and caliber. Severe calcific atherosclerosis. No lymphadenopathy by CT size criteria. REPRODUCTIVE: Normal. OTHER: Small amount of ascites and, retroperitoneal free fluid. No rim enhancing fluid collection. No intraperitoneal free air. MUSCULOSKELETAL: Nonacute. Bilateral breast implants. Osteopenia. Old moderate L1 compression fracture. Moderate L5-S1 degenerative disc. Small fat containing umbilical hernia. IMPRESSION: 1. Acute severe pancreatitis without necrosis. Small volume ascites and retroperitoneal free fluid, no abscess. 2. 3 mm nonobstructing LEFT nephrolithiasis. Aortic Atherosclerosis (ICD10-I70.0). Electronically Signed   By: Awilda Metro M.D.   On: 04/05/2017 02:21     Medications:  Scheduled: . aspirin EC  81 mg Oral Q M,W,F  . enoxaparin (LOVENOX) injection  40 mg Subcutaneous Q24H  . insulin aspart  0-9 Units Subcutaneous Q4H  . metroNIDAZOLE  1 application Topical Daily  . prednisoLONE acetate  1 drop Both Eyes Daily   Continuous: . sodium chloride 125 mL/hr at 04/06/17 0456   WUJ:[id number] **OR** acetaminophen, hydrALAZINE,  HYDROmorphone (DILAUDID) injection, mometasone, ondansetron **OR** ondansetron (ZOFRAN) IV, promethazine  Assessment/Plan:  Principal Problem:   Idiopathic acute pancreatitis Active Problems:   Diabetes mellitus without complication (HCC)   Essential hypertension   Leukocytosis    Idiopathic acute pancreatitis Etiology unclear. She denies any alcohol intake. No gallstones detected on ultrasound done earlier this year. Triglyceride levels normal. She is on rosuvastatin, but has been on this for at least 3 years. We are holding it for now. Patient will need further workup. She has seen Dr. Matthias Hughs with [facility] gastroenterology previously for colonoscopy. She will need to see him again for further evaluation of acute pancreatitis. This can be done as an outpatient if she continues to improve. Patient improving clinically. Lipase level noted to be higher today compared to the one at admission. Significance of this is not clear. Repeat tomorrow. CT scan not showing any evidence for necrosis. WBC also noted to be high today. Continue to monitor for now.   History of essential hypertension Elevated blood pressure likely due to pain. Hydralazine as needed. She does take metoprolol at home, which can be resumed.  Diabetes mellitus type II Holding metformin. Continue sliding scale insulin coverage.  DVT Prophylaxis: Lovenox    Code Status: Full code  Family Communication: Discussed  with the patient  Disposition Plan: Management as outlined above. Mobilize.    LOS: 1 day   [facility]  [facility] Pager [phone] 04/06/2017, 8:25 AM  If 7PM-7AM, please contact night-coverage at [email or web address], password [facility]

## 2017-04-07 ENCOUNTER — Inpatient Hospital Stay (HOSPITAL_COMMUNITY): Payer: Medicare Other

## 2017-04-07 LAB — CBC
HCT: 37.5 % (ref 36.0–46.0)
Hemoglobin: 12.5 g/dL (ref 12.0–15.0)
MCH: 32.6 pg (ref 26.0–34.0)
MCHC: 33.3 g/dL (ref 30.0–36.0)
MCV: 97.9 fL (ref 78.0–100.0)
Platelets: 137 10*3/uL — ABNORMAL LOW (ref 150–400)
RBC: 3.83 MIL/uL — ABNORMAL LOW (ref 3.87–5.11)
RDW: 14.2 % (ref 11.5–15.5)
WBC: 22.8 10*3/uL — ABNORMAL HIGH (ref 4.0–10.5)

## 2017-04-07 LAB — LIPASE, BLOOD: Lipase: 124 U/L — ABNORMAL HIGH (ref 11–51)

## 2017-04-07 LAB — GLUCOSE, CAPILLARY
Glucose-Capillary: 103 mg/dL — ABNORMAL HIGH (ref 65–99)
Glucose-Capillary: 110 mg/dL — ABNORMAL HIGH (ref 65–99)
Glucose-Capillary: 118 mg/dL — ABNORMAL HIGH (ref 65–99)
Glucose-Capillary: 120 mg/dL — ABNORMAL HIGH (ref 65–99)
Glucose-Capillary: 130 mg/dL — ABNORMAL HIGH (ref 65–99)
Glucose-Capillary: 96 mg/dL (ref 65–99)

## 2017-04-07 LAB — COMPREHENSIVE METABOLIC PANEL
ALT: 13 U/L — ABNORMAL LOW (ref 14–54)
AST: 28 U/L (ref 15–41)
Albumin: 2.8 g/dL — ABNORMAL LOW (ref 3.5–5.0)
Alkaline Phosphatase: 49 U/L (ref 38–126)
Anion gap: 9 (ref 5–15)
BUN: 15 mg/dL (ref 6–20)
CO2: 21 mmol/L — ABNORMAL LOW (ref 22–32)
Calcium: 8.1 mg/dL — ABNORMAL LOW (ref 8.9–10.3)
Chloride: 109 mmol/L (ref 101–111)
Creatinine, Ser: 0.73 mg/dL (ref 0.44–1.00)
GFR calc Af Amer: >60 mL/min (ref >60)
GFR calc non Af Amer: >60 mL/min (ref >60)
Glucose, Bld: 109 mg/dL — ABNORMAL HIGH (ref 65–99)
Potassium: 3.7 mmol/L (ref 3.5–5.1)
Sodium: 139 mmol/L (ref 135–145)
Total Bilirubin: 0.8 mg/dL (ref 0.3–1.2)
Total Protein: 5.8 g/dL — ABNORMAL LOW (ref 6.5–8.1)

## 2017-04-07 MED ORDER — FUROSEMIDE 10 MG/ML IJ SOLN
40.0000 mg | Freq: Once | INTRAMUSCULAR | Status: AC
  Administered 2017-04-07: 40 mg via INTRAVENOUS
  Filled 2017-04-07: qty 4

## 2017-04-07 NOTE — Progress Notes (Signed)
Pt c/o increased SOB this shift. No acute distress noted yet RR 22 and sats 94-95% on 2 L O2. Denies chest pain and lungs clear upon auscultation. HOB raised. Dr. Rito Ehrlich aware vis phone see new orders received.

## 2017-04-07 NOTE — Progress Notes (Signed)
[facility] PROGRESS NOTE  Robin Brady:[id number] DOB: [birth year] DOA: 04/04/2017  PCP: Richmond Campbell., PA-C  Brief History/Interval Summary: 71 year old Caucasian female with a past medical history of diabetes, hypertension, idiopathic acute pancreatitis in January of 2018 , presented with complaints of abdominal pain, nausea and vomiting. She was found to have elevated lipase level. CT scan showed acute pancreatitis. She was hospitalized for further management.  Reason for Visit: Acute pancreatitis  Consultants: None  Procedures: None  Antibiotics: None  Subjective/Interval History: Patient is feeling much better. Pain has improved significantly. No nausea or vomiting. Still not passing much gas from below. Still feels somewhat bloated.   ROS: No chest pain or shortness of breath  Objective:  Vital Signs  Vitals:   04/06/17 0900 04/06/17 1340 04/06/17 2006 04/07/17 0423  BP:  (!) 144/68 (!) 160/61 (!) 180/61  Pulse:  85 86 73  Resp:  Temp:  98.7 F (37.1 C) 99.9 F (37.7 C) 99.7 F (37.6 C)  TempSrc:  Oral Oral Oral  SpO2:  90% 93% 95%  Weight:      Height:  (1.626 m)       Intake/Output Summary (Last 24 hours) at 04/07/17 0801 Last data filed at 04/07/17 0423  Gross per 24 hour  Intake          3347.92 ml  Output             1750 ml  Net          1597.92 ml   Filed Weights   04/04/17 2056 04/06/17 0010  Weight: 71 kg (156 lb 9.6 oz) 69.8 kg (153 lb 14.1 oz)    General appearance: Awake and alert. In no distress. Resp: Clear to auscultation bilaterally Cardio: S1, S2 is normal, regular. No S3, S4. No rubs, murmurs or bruit GI: Abdomen is soft. Not tender today. Bowel sounds present. Slightly distended. No masses or organomegaly. Extremities: No significant edema Neurologic: No obvious focal neurological deficits.  Lab Results:  Data Reviewed: I have personally reviewed following labs and imaging  studies  CBC:  Recent Labs Lab 04/04/17 2244 04/05/17 0451 04/06/17 0406 04/07/17 0453  WBC 22.3* 15.9* 21.1* 22.8*  HGB 14.1 13.1 13.5 12.5  HCT 41.1 39.0 41.1 37.5  MCV 97.6 98.0 100.2* 97.9  PLT 269 216 182 137*    Basic Metabolic Panel:  Recent Labs Lab 04/04/17 2244 04/05/17 0451 04/06/17 0406 04/07/17 0453  NA 140 144 143 139  K 4.4 4.2 4.1 3.7  CL 106 111 114* 109  CO2 22 22 21* 21*  GLUCOSE 165* 163* 118* 109*  BUN 30* 27* 20 15  CREATININE 1.26* 1.07* 0.94 0.73  CALCIUM 10.0 8.8* 8.0* 8.1*    GFR: Estimated Creatinine Clearance: 60.9 mL/min (by C-G formula based on SCr of 0.73 mg/dL).  Liver Function Tests:  Recent Labs Lab 04/04/17 2244 04/05/17 0451 04/06/17 0406 04/07/17 0453  AST ALT 14 14 11* 13*  ALKPHOS 49 47 42 49  BILITOT 0.5 0.6 0.6 0.8  PROT 7.6 6.5 5.9* 5.8*  ALBUMIN 4.2 3.7 3.1* 2.8*     Recent Labs Lab 04/04/17 2244 04/06/17 0406 04/07/17 0453  LIPASE 323* 501* 124*   CBG:  Recent Labs Lab 04/06/17 1648 04/06/17 1959 04/07/17 0027 04/07/17 0420 04/07/17 0738  GLUCAP 118* 110* 118* 110* 96    Lipid Profile:  Recent Labs  04/05/17 0451  CHOL 181  HDL 55  LDLCALC 110*  TRIG 82  CHOLHDL 3.3     Radiology Studies: No results found.   Medications:  Scheduled: . aspirin EC  81 mg Oral Q M,W,F  . enoxaparin (LOVENOX) injection  40 mg Subcutaneous Q24H  . insulin aspart  0-9 Units Subcutaneous Q4H  . metoprolol succinate  25 mg Oral Daily  . metroNIDAZOLE  1 application Topical Daily  . prednisoLONE acetate  1 drop Both Eyes Daily   Continuous: . sodium chloride 100 mL/hr at 04/06/17 2305   Brady:[id number] **OR** acetaminophen, hydrALAZINE, HYDROmorphone (DILAUDID) injection, mometasone, ondansetron **OR** ondansetron (ZOFRAN) IV, promethazine  Assessment/Plan:  Principal Problem:   Idiopathic acute pancreatitis Active Problems:   Diabetes mellitus without complication (HCC)    Essential hypertension   Leukocytosis    Idiopathic acute pancreatitis Etiology unclear. She denies any alcohol intake. No gallstones detected on ultrasound done earlier this year. Triglyceride levels normal. She is on rosuvastatin, but has been on this for at least 3 years. We are holding it for now. Patient will need further workup to elucidate the reason for her pancreatitis. She has seen Dr. Matthias Hughs with [facility] gastroenterology previously for colonoscopy. She will need to see him again for further evaluation of acute pancreatitis. This can be done as an outpatient. Patient continues to improve. Her pain has much better. Her lipase level has improved. Abdomen still remained somewhat distended, but she does have bowel sounds. She has been asked to ambulate. She will be put on clear liquid diet. CT scan did not show any evidence for necrosis. No need for antibiotics. Elevated WBC and mild fever, most likely due to just inflammation. Continue to monitor for now. Cutback on IV fluids.  History of essential hypertension Elevated blood pressure likely due to pain. Hydralazine as needed. Metoprolol was resumed yesterday. Blood pressure remains elevated. Continue to monitor.   Diabetes mellitus type II Holding metformin. Continue sliding scale insulin coverage.  DVT Prophylaxis: Lovenox    Code Status: Full code  Family Communication: Discussed with the patient  Disposition Plan: Management as outlined above. Mobilize. Discussed with nurse.    LOS: 2 days   [facility]  [facility] Pager [phone] 04/07/2017, 8:01 AM  If 7PM-7AM, please contact night-coverage at [email or web address], password [facility]

## 2017-04-08 DIAGNOSIS — E8779 Other fluid overload: Secondary | ICD-10-CM

## 2017-04-08 DIAGNOSIS — E876 Hypokalemia: Secondary | ICD-10-CM

## 2017-04-08 LAB — GLUCOSE, CAPILLARY
Glucose-Capillary: 105 mg/dL — ABNORMAL HIGH (ref 65–99)
Glucose-Capillary: 117 mg/dL — ABNORMAL HIGH (ref 65–99)
Glucose-Capillary: 117 mg/dL — ABNORMAL HIGH (ref 65–99)
Glucose-Capillary: 117 mg/dL — ABNORMAL HIGH (ref 65–99)
Glucose-Capillary: 146 mg/dL — ABNORMAL HIGH (ref 65–99)
Glucose-Capillary: 193 mg/dL — ABNORMAL HIGH (ref 65–99)

## 2017-04-08 LAB — BASIC METABOLIC PANEL
Anion gap: 11 (ref 5–15)
BUN: 13 mg/dL (ref 6–20)
CO2: 24 mmol/L (ref 22–32)
Calcium: 7.9 mg/dL — ABNORMAL LOW (ref 8.9–10.3)
Chloride: 100 mmol/L — ABNORMAL LOW (ref 101–111)
Creatinine, Ser: 0.76 mg/dL (ref 0.44–1.00)
GFR calc Af Amer: >60 mL/min (ref >60)
GFR calc non Af Amer: >60 mL/min (ref >60)
Glucose, Bld: 115 mg/dL — ABNORMAL HIGH (ref 65–99)
Potassium: 2.9 mmol/L — ABNORMAL LOW (ref 3.5–5.1)
Sodium: 135 mmol/L (ref 135–145)

## 2017-04-08 LAB — CBC
HCT: 34.1 % — ABNORMAL LOW (ref 36.0–46.0)
Hemoglobin: 11.7 g/dL — ABNORMAL LOW (ref 12.0–15.0)
MCH: 32.6 pg (ref 26.0–34.0)
MCHC: 34.3 g/dL (ref 30.0–36.0)
MCV: 95 fL (ref 78.0–100.0)
Platelets: 144 10*3/uL — ABNORMAL LOW (ref 150–400)
RBC: 3.59 MIL/uL — ABNORMAL LOW (ref 3.87–5.11)
RDW: 13.3 % (ref 11.5–15.5)
WBC: 18 10*3/uL — ABNORMAL HIGH (ref 4.0–10.5)

## 2017-04-08 MED ORDER — POTASSIUM CHLORIDE CRYS ER 20 MEQ PO TBCR
40.0000 meq | EXTENDED_RELEASE_TABLET | Freq: Once | ORAL | Status: AC
  Administered 2017-04-08: 40 meq via ORAL
  Filled 2017-04-08: qty 2

## 2017-04-08 MED ORDER — POTASSIUM CHLORIDE 20 MEQ PO PACK: 40.0000 meq | PACK | Freq: Once | ORAL | Status: DC

## 2017-04-08 MED ORDER — FUROSEMIDE 10 MG/ML IJ SOLN
40.0000 mg | Freq: Once | INTRAMUSCULAR | Status: AC
  Administered 2017-04-08: 40 mg via INTRAVENOUS
  Filled 2017-04-08: qty 4

## 2017-04-08 MED ORDER — POTASSIUM CHLORIDE 10 MEQ/100ML IV SOLN
10.0000 meq | INTRAVENOUS | Status: AC
  Administered 2017-04-08 (×4): 10 meq via INTRAVENOUS
  Filled 2017-04-08 (×4): qty 100

## 2017-04-08 NOTE — Progress Notes (Signed)
Text message sent to on call hospitalist regarding low K+ level of 2.9 this morning. Awaiting orders.

## 2017-04-08 NOTE — Progress Notes (Signed)
[facility] PROGRESS NOTE  Robin Brady EAV:[id number] DOB: 1945/08/04 DOA: 04/04/2017  PCP: Richmond Campbell., PA-C  Brief History/Interval Summary: 71 year old Caucasian female with a past medical history of diabetes, hypertension, idiopathic acute pancreatitis in January of 2018 , presented with complaints of abdominal pain, nausea and vomiting. She was found to have elevated lipase level. CT scan showed acute pancreatitis. She was hospitalized for further management.  Reason for Visit: Acute pancreatitis  Consultants: None  Procedures: None  Antibiotics: None  Subjective/Interval History: Patient mentioned that she developed shortness of breath yesterday evening. After getting Lasix she urinated a lot and is feeling much better. Still somewhat short of breath this morning. Denies any chest pain. Denies any history of heart disease. Abdominal pain has improved. She is passing gas now and does not feel as bloated as yesterday. Tolerating her clear liquids.   ROS: Denies any headaches.  Objective:  Vital Signs  Vitals:   04/07/17 0423 04/07/17 1751 04/07/17 2100 04/08/17 0515  BP: (!) 180/61 (!) 167/88 (!) 148/65 (!) 133/52  Pulse: 73 71 74 65  Resp: Temp: 99.7 F (37.6 C) 99.4 F (37.4 C) 99.7 F (37.6 C) 99.4 F (37.4 C)  TempSrc: Oral Oral Oral Oral  SpO2: 95% 95% 95% 95%  Weight:      Height:        Intake/Output Summary (Last 24 hours) at 04/08/17 7829 Last data filed at 04/08/17 0733  Gross per 24 hour  Intake              500 ml  Output             2850 ml  Net            -2350 ml   Filed Weights   04/04/17 2056 04/06/17 0010  Weight: 71 kg (156 lb 9.6 oz) 69.8 kg (153 lb 14.1 oz)    General appearance: Awake, alert. In no distress. Resp: Mildly tachypneic. Does have crackles in the bases. No wheezing or rhonchi. Cardio: S1, S2 is normal, regular. No S3, S4. No rubs, murmurs, or bruit GI: Abdomen is soft. Nontender. Not as  distended as yesterday. Bowel sounds are present. No masses or organomegaly. Extremities: No pedal edema Neurologic: No obvious focal neurological deficits.  Lab Results:  Data Reviewed: I have personally reviewed following labs and imaging studies  CBC:  Recent Labs Lab 04/04/17 2244 04/05/17 0451 04/06/17 0406 04/07/17 0453 04/08/17 0527  WBC 22.3* 15.9* 21.1* 22.8* 18.0*  HGB 14.1 13.1 13.5 12.5 11.7*  HCT 41.1 39.0 41.1 37.5 34.1*  MCV 97.6 98.0 100.2* 97.9 95.0  PLT 269 216 182 137* 144*    Basic Metabolic Panel:  Recent Labs Lab 04/04/17 2244 04/05/17 0451 04/06/17 0406 04/07/17 0453 04/08/17 0527  NA 140 144 143 139 135  K 4.4 4.2 4.1 3.7 2.9*  CL 106 111 114* 109 100*  CO2 22 22 21* 21* 24  GLUCOSE 165* 163* 118* 109* 115*  BUN 30* 27* CREATININE 1.26* 1.07* 0.94 0.73 0.76  CALCIUM 10.0 8.8* 8.0* 8.1* 7.9*    GFR: Estimated Creatinine Clearance: 60.9 mL/min (by C-G formula based on SCr of 0.76 mg/dL).  Liver Function Tests:  Recent Labs Lab 04/04/17 2244 04/05/17 0451 04/06/17 0406 04/07/17 0453  AST ALT 14 14 11* 13*  ALKPHOS 49 47 42 49  BILITOT 0.5 0.6 0.6 0.8  PROT 7.6 6.5  5.9* 5.8*  ALBUMIN 4.2 3.7 3.1* 2.8*     Recent Labs Lab 04/04/17 2244 04/06/17 0406 04/07/17 0453  LIPASE 323* 501* 124*   CBG:  Recent Labs Lab 04/07/17 1637 04/07/17 2103 04/08/17 0031 04/08/17 0312 04/08/17 0732  GLUCAP 103* 130* 117* 117* 105*     Radiology Studies: Dg Chest Port 1 View  Result Date: 04/07/2017 CLINICAL DATA:  Dyspnea at rest EXAM: PORTABLE CHEST 1 VIEW COMPARISON:  07/20/2016 chest radiograph. FINDINGS: Low lung volumes. Stable cardiomediastinal silhouette with top-normal heart size. No pneumothorax. Small bilateral pleural effusions. Patchy bibasilar lung opacities. No pulmonary edema. IMPRESSION: 1. Small bilateral pleural effusions. 2. Low lung volumes. Patchy bibasilar lung opacities, which could  represent aspiration, atelectasis or pneumonia . Electronically Signed   By: Delbert Phenix M.D.   On: 04/07/2017 18:21     Medications:  Scheduled: . aspirin EC  81 mg Oral Q M,W,F  . enoxaparin (LOVENOX) injection  40 mg Subcutaneous Q24H  . furosemide  40 mg Intravenous Once  . insulin aspart  0-9 Units Subcutaneous Q4H  . metoprolol succinate  25 mg Oral Daily  . metroNIDAZOLE  1 application Topical Daily  . potassium chloride  40 mEq Oral Once  . prednisoLONE acetate  1 drop Both Eyes Daily   Continuous: . potassium chloride 10 mEq (04/08/17 0800)   ZOX:[id number] **OR** acetaminophen, hydrALAZINE, HYDROmorphone (DILAUDID) injection, mometasone, ondansetron **OR** ondansetron (ZOFRAN) IV, promethazine  Assessment/Plan:  Principal Problem:   Idiopathic acute pancreatitis Active Problems:   Diabetes mellitus without complication (HCC)   Essential hypertension   Leukocytosis    Idiopathic acute pancreatitis Etiology unclear. She denies any alcohol intake. No gallstones detected on ultrasound done earlier this year. Triglyceride levels normal. She is on rosuvastatin, but has been on this for at least 3 years. We are holding it for now. Patient will need further workup to elucidate the reason for her pancreatitis. She has seen Dr. Matthias Hughs with [facility] gastroenterology previously for colonoscopy. She will need to see him again for further evaluation of acute pancreatitis. This can be done as an outpatient. Patient continues to improve from a pancreatitis standpoint. She is passing gas now. Advance to full liquids diet. WBC is improving. IV fluids discontinued due to fluid overload.   Dyspnea secondary to fluid overload IV fluids were stopped yesterday. She was given Lasix with improvement. Continues to have some crackles in the lungs this morning. She'll be given an additional dose of Lasix today. Monitor urine output. Fluid overload is due to aggressive IV hydration. Echocardiogram  from 2015, showed diastolic dysfunction with normal systolic function. No indication to repeat another echocardiogram at this time.  Hypokalemia. Secondary to diuretics. This will be repleted.   History of essential hypertension Elevated blood pressure likely due to pain. Hydralazine as needed. Metoprolol was resumed. Blood pressure remains elevated but improved. Continue to monitor.   Diabetes mellitus type II Holding metformin. Continue sliding scale insulin coverage. CBGs are reasonably well controlled.  DVT Prophylaxis: Lovenox    Code Status: Full code  Family Communication: Discussed with the patient  Disposition Plan: Continue to mobilize. Management as outlined above.    LOS: 3 days   [facility]  [facility] Pager [phone] 04/08/2017, 8:22 AM  If 7PM-7AM, please contact night-coverage at [email or web address], password [facility]

## 2017-04-08 NOTE — Care Management Important Message (Signed)
Important Message  Patient Details  Name: Robin Brady MRN: [id number] Date of Birth: 04-19-1946   Medicare Important Message Given:  Yes    Caren Macadam 04/08/2017, 10:28 AMImportant Message  Patient Details  Name: Robin Brady MRN: [id number] Date of Birth: May 12, 1946   Medicare Important Message Given:  Yes    Caren Macadam 04/08/2017, 10:28 AM

## 2017-04-09 LAB — BASIC METABOLIC PANEL
Anion gap: 8 (ref 5–15)
BUN: 12 mg/dL (ref 6–20)
CO2: 25 mmol/L (ref 22–32)
Calcium: 8.5 mg/dL — ABNORMAL LOW (ref 8.9–10.3)
Chloride: 104 mmol/L (ref 101–111)
Creatinine, Ser: 0.82 mg/dL (ref 0.44–1.00)
GFR calc Af Amer: >60 mL/min (ref >60)
GFR calc non Af Amer: >60 mL/min (ref >60)
Glucose, Bld: 117 mg/dL — ABNORMAL HIGH (ref 65–99)
Potassium: 3.7 mmol/L (ref 3.5–5.1)
Sodium: 137 mmol/L (ref 135–145)

## 2017-04-09 LAB — CBC
HCT: 34.3 % — ABNORMAL LOW (ref 36.0–46.0)
Hemoglobin: 11.9 g/dL — ABNORMAL LOW (ref 12.0–15.0)
MCH: 32.8 pg (ref 26.0–34.0)
MCHC: 34.7 g/dL (ref 30.0–36.0)
MCV: 94.5 fL (ref 78.0–100.0)
Platelets: 179 10*3/uL (ref 150–400)
RBC: 3.63 MIL/uL — ABNORMAL LOW (ref 3.87–5.11)
RDW: 13.3 % (ref 11.5–15.5)
WBC: 18 10*3/uL — ABNORMAL HIGH (ref 4.0–10.5)

## 2017-04-09 LAB — GLUCOSE, CAPILLARY
Glucose-Capillary: 102 mg/dL — ABNORMAL HIGH (ref 65–99)
Glucose-Capillary: 110 mg/dL — ABNORMAL HIGH (ref 65–99)
Glucose-Capillary: 112 mg/dL — ABNORMAL HIGH (ref 65–99)
Glucose-Capillary: 119 mg/dL — ABNORMAL HIGH (ref 65–99)
Glucose-Capillary: 170 mg/dL — ABNORMAL HIGH (ref 65–99)
Glucose-Capillary: 190 mg/dL — ABNORMAL HIGH (ref 65–99)

## 2017-04-09 MED ORDER — INSULIN ASPART 100 UNIT/ML ~~LOC~~ SOLN: 0.0000 [IU] | Freq: Three times a day (TID) | SUBCUTANEOUS | Status: DC

## 2017-04-09 MED ORDER — FUROSEMIDE 10 MG/ML IJ SOLN
40.0000 mg | Freq: Once | INTRAMUSCULAR | Status: AC
  Administered 2017-04-09: 40 mg via INTRAVENOUS
  Filled 2017-04-09: qty 4

## 2017-04-09 MED ORDER — POTASSIUM CHLORIDE CRYS ER 20 MEQ PO TBCR
40.0000 meq | EXTENDED_RELEASE_TABLET | Freq: Once | ORAL | Status: AC
  Administered 2017-04-09: 40 meq via ORAL
  Filled 2017-04-09: qty 2

## 2017-04-09 NOTE — Progress Notes (Signed)
[facility] PROGRESS NOTE  Robin Brady ZOX:[id number] DOB: 1946-03-02 DOA: 04/04/2017  PCP: Richmond Campbell., PA-C  Brief History/Interval Summary: 71 year old Caucasian female with a past medical history of diabetes, hypertension, idiopathic acute pancreatitis in January of 2018 , presented with complaints of abdominal pain, nausea and vomiting. She was found to have elevated lipase level. CT scan showed acute pancreatitis. She was hospitalized for further management. Patient was slow to improve. Subsequently, she does the fluid overload and was given Lasix with improvement.  Reason for Visit: Acute pancreatitis  Consultants: None  Procedures: None  Antibiotics: None  Subjective/Interval History: Patient states that she is feeling much better this morning. Her shortness of breath has almost completely resolved. She denies any chest pain. Abdomen still feels bloated. She is passing gas. No bowel movements yet. Has tolerated full liquids.   ROS: Denies any headaches.  Objective:  Vital Signs  Vitals:   04/08/17 0515 04/08/17 1343 04/08/17 2131 04/09/17 0540  BP: (!) 133/52 138/62 (!) 147/63 (!) 144/70  Pulse: 65 72 72 70  Resp: Temp: 99.4 F (37.4 C) 98.8 F (37.1 C) 99.3 F (37.4 C) 99.4 F (37.4 C)  TempSrc: Oral Oral Oral Oral  SpO2: 95% 95% 95% 94%  Weight:      Height:        Intake/Output Summary (Last 24 hours) at 04/09/17 1039 Last data filed at 04/08/17 1400  Gross per 24 hour  Intake              180 ml  Output             1900 ml  Net            -1720 ml   Filed Weights   04/04/17 2056 04/06/17 0010  Weight: 71 kg (156 lb 9.6 oz) 69.8 kg (153 lb 14.1 oz)    General appearance: Awake, alert. In no distress Resp: Normal effort. Continues to have crackles at the bases but much less compared to yesterday. No wheezing or rhonchi. Cardio: S1, S2 is normal, regular. No S3, S4. GI: Abdomen remains soft. Less distended compared to  before. Bowel sounds are present. Nontender. No masses or organomegaly. Extremities: No pedal edema Neurologic: No focal neurological deficits  Lab Results:  Data Reviewed: I have personally reviewed following labs and imaging studies  CBC:  Recent Labs Lab 04/05/17 0451 04/06/17 0406 04/07/17 0453 04/08/17 0527 04/09/17 0811  WBC 15.9* 21.1* 22.8* 18.0* 18.0*  HGB 13.1 13.5 12.5 11.7* 11.9*  HCT 39.0 41.1 37.5 34.1* 34.3*  MCV 98.0 100.2* 97.9 95.0 94.5  PLT 216 182 137* 144* 179    Basic Metabolic Panel:  Recent Labs Lab 04/05/17 0451 04/06/17 0406 04/07/17 0453 04/08/17 0527 04/09/17 0811  NA 144 143 139 135 137  K 4.2 4.1 3.7 2.9* 3.7  CL 111 114* 109 100* 104  CO2 22 21* 21* 24 25  GLUCOSE 163* 118* 109* 115* 117*  BUN 27* CREATININE 1.07* 0.94 0.73 0.76 0.82  CALCIUM 8.8* 8.0* 8.1* 7.9* 8.5*    GFR: Estimated Creatinine Clearance: 59.4 mL/min (by C-G formula based on SCr of 0.82 mg/dL).  Liver Function Tests:  Recent Labs Lab 04/04/17 2244 04/05/17 0451 04/06/17 0406 04/07/17 0453  AST ALT 14 14 11* 13*  ALKPHOS 49 47 42 49  BILITOT 0.5 0.6 0.6 0.8  PROT 7.6 6.5 5.9* 5.8*  ALBUMIN  4.2 3.7 3.1* 2.8*     Recent Labs Lab 04/04/17 2244 04/06/17 0406 04/07/17 0453  LIPASE 323* 501* 124*   CBG:  Recent Labs Lab 04/08/17 1540 04/08/17 1954 04/09/17 0009 04/09/17 0439 04/09/17 0743  GLUCAP 146* 193* 102* 119* 110*     Radiology Studies: Dg Chest Port 1 View  Result Date: 04/07/2017 CLINICAL DATA:  Dyspnea at rest EXAM: PORTABLE CHEST 1 VIEW COMPARISON:  07/20/2016 chest radiograph. FINDINGS: Low lung volumes. Stable cardiomediastinal silhouette with top-normal heart size. No pneumothorax. Small bilateral pleural effusions. Patchy bibasilar lung opacities. No pulmonary edema. IMPRESSION: 1. Small bilateral pleural effusions. 2. Low lung volumes. Patchy bibasilar lung opacities, which could represent  aspiration, atelectasis or pneumonia . Electronically Signed   By: Delbert Phenix M.D.   On: 04/07/2017 18:21     Medications:  Scheduled: . aspirin EC  81 mg Oral Q M,W,F  . enoxaparin (LOVENOX) injection  40 mg Subcutaneous Q24H  . insulin aspart  0-9 Units Subcutaneous Q4H  . metoprolol succinate  25 mg Oral Daily  . metroNIDAZOLE  1 application Topical Daily  . prednisoLONE acetate  1 drop Both Eyes Daily   Continuous:  ZOX:[id number] **OR** acetaminophen, hydrALAZINE, HYDROmorphone (DILAUDID) injection, mometasone, ondansetron **OR** ondansetron (ZOFRAN) IV, promethazine  Assessment/Plan:  Principal Problem:   Idiopathic acute pancreatitis Active Problems:   Diabetes mellitus without complication (HCC)   Essential hypertension   Leukocytosis    Idiopathic acute pancreatitis Etiology unclear. She denies any alcohol intake. No gallstones detected on ultrasound done earlier this year. Triglyceride levels normal. She is on rosuvastatin, but has been on this for at least 3 years. We are holding it for now. Patient will need further workup to elucidate the reason for her pancreatitis. She has seen Dr. Matthias Hughs with [facility] gastroenterology previously for colonoscopy. She will need to see him again for further evaluation of acute pancreatitis. This can be done as an outpatient. Patient continues to improve from a pancreatitis standpoint. Advance to soft diet today. WBC remains high but stable. IV fluids were discontinued due to fluid overload.    Dyspnea secondary to fluid overload Patient was given diuretics. She had a brisk diuresis. IV fluids were discontinued. Patient has significantly improved. She continues to have some crackles in her lungs today. She'll be given 1 more dose of IV Lasix today. Further doses to be determined based on how she appears clinically tomorrow. Continue to monitor urine output. Fluid overload was due to aggressive IV hydration and pancreatitis.  Echocardiogram from 2015, showed diastolic dysfunction with normal systolic function. No indication to repeat another echocardiogram at this time.  Hypokalemia. Secondary to diuretics. Potassium level is better today. She'll be given an additional dose of potassium.  History of essential hypertension Elevated blood pressure likely due to pain. Hydralazine as needed. Metoprolol was resumed. Blood pressure has improved. Continue to monitor.    Diabetes mellitus type II Holding metformin. Continue sliding scale insulin coverage. CBGs are reasonably well controlled.  DVT Prophylaxis: Lovenox    Code Status: Full code  Family Communication: Discussed with the patient  Disposition Plan: Continue to mobilize. Advance diet today. Additional dose of IV Lasix today. Reassess tomorrow. Anticipate discharge in 24-48 hours.    LOS: 4 days   [facility]  [facility] Pager [phone] 04/09/2017, 10:39 AM  If 7PM-7AM, please contact night-coverage at [email or web address], password [facility]

## 2017-04-10 DIAGNOSIS — K8500 Idiopathic acute pancreatitis without necrosis or infection: Principal | ICD-10-CM

## 2017-04-10 LAB — BASIC METABOLIC PANEL
Anion gap: 10 (ref 5–15)
BUN: 15 mg/dL (ref 6–20)
CO2: 21 mmol/L — ABNORMAL LOW (ref 22–32)
Calcium: 8.4 mg/dL — ABNORMAL LOW (ref 8.9–10.3)
Chloride: 102 mmol/L (ref 101–111)
Creatinine, Ser: 0.76 mg/dL (ref 0.44–1.00)
GFR calc Af Amer: >60 mL/min (ref >60)
GFR calc non Af Amer: >60 mL/min (ref >60)
Glucose, Bld: 129 mg/dL — ABNORMAL HIGH (ref 65–99)
Potassium: 3.8 mmol/L (ref 3.5–5.1)
Sodium: 133 mmol/L — ABNORMAL LOW (ref 135–145)

## 2017-04-10 LAB — GLUCOSE, CAPILLARY
Glucose-Capillary: 108 mg/dL — ABNORMAL HIGH (ref 65–99)
Glucose-Capillary: 122 mg/dL — ABNORMAL HIGH (ref 65–99)

## 2017-04-10 NOTE — Progress Notes (Signed)
Pt was discharged home today. Instructions were reviewed with patient, and questions were answered. Pt was taken to main entrance via wheelchair by NT.  

## 2017-04-10 NOTE — Discharge Summary (Addendum)
Physician Discharge Summary  Robin Brady XBJ:[id number] DOB: 1945-07-30 DOA: 04/04/2017  PCP: Richmond Campbell., PA-C  Admit date: 04/04/2017 Discharge date: 04/10/2017  Time spent: > 35 minutes  Recommendations for Outpatient Follow-up:  1. Discontinued crestor in patient with idiopathic pancreatitis 2. Recommended GI follow up. Which patient verbalizes understanding and agreement.   Discharge Diagnoses:  Principal Problem:   Idiopathic acute pancreatitis Active Problems:   Diabetes mellitus without complication [facility])   Essential hypertension   Leukocytosis   Discharge Condition: stable  Diet recommendation: low fat diet  Filed Weights   04/04/17 2056 04/06/17 0010  Weight: 71 kg (156 lb 9.6 oz) 69.8 kg (153 lb 14.1 oz)    History of present illness:  71 year old Caucasian female with a past medical history of diabetes, hypertension, idiopathic acute pancreatitis in January of 2018 , presented with complaints of abdominal pain, nausea and vomiting. She was found to have elevated lipase level. CT scan showed acute pancreatitis. She was hospitalized for further management. During hospital stay required lasix administration for fluid overload.  Hospital Course:  Acute pancreatitis - I suspect after review of case this is secondary to crestor (second time this occurs with no clear cause). As such will d/c crestor. Have indicated that patient follow up with her primary care physician for further evaluation and recommendations.  Procedures:  none  Consultations:  none  Discharge Exam: Vitals:   04/09/17 2142 04/10/17 0934  BP: (!) 149/72 (!) 145/65  Pulse: 70 72  Resp: 18   Temp: 99.4 F (37.4 C)   SpO2: 93%     General: Pt in nad, alert and awake Cardiovascular: rrr, no rubs Respiratory: no increased wob, no wheezes  Discharge Instructions   Discharge Instructions    Call MD for:  extreme fatigue    Complete by:  As directed    Call MD for:  severe  uncontrolled pain    Complete by:  As directed    Diet - low sodium heart healthy    Complete by:  As directed    Discharge instructions    Complete by:  As directed    Please schedule follow up appointment with your GI specialist for further evaluation and recommendations.   Increase activity slowly    Complete by:  As directed      Current Discharge Medication List    CONTINUE these medications which have NOT CHANGED   Details  aspirin EC 81 MG tablet Take 81 mg by mouth every Monday, Wednesday, and Friday.    Calcium Carbonate-Vitamin D 600-400 MG-UNIT tablet Take 1 tablet by mouth daily.    Cholecalciferol (VITAMIN D) 2000 UNITS CAPS Take 2,000 Units by mouth daily.     metFORMIN (GLUCOPHAGE-XR) 500 MG 24 hr tablet Take 500 mg by mouth daily with breakfast.    metoprolol succinate (TOPROL-XL) 25 MG 24 hr tablet Take 25 mg by mouth daily.    metroNIDAZOLE (METROGEL) 0.75 % gel Apply 1 application topically daily. Refills: 2    mometasone (ELOCON) 0.1 % cream Apply 1 application topically daily as needed (rash).     Omega 3 1000 MG CAPS Take 3,000 mg by mouth 2 (two) times daily with a meal.     !! OVER THE COUNTER MEDICATION Take 1 tablet by mouth daily. Bio repair- multivitamin    !! OVER THE COUNTER MEDICATION Take 1 scoop by mouth daily. All day energy greens, powder    polyethylene glycol (MIRALAX / GLYCOLAX) packet Take 17 g  by mouth daily.    PrednisoLONE Acetate (PRED FORTE OP) Place 1 drop into both eyes daily.     vitamin B-12 (CYANOCOBALAMIN) 1000 MCG tablet Take 1,000 mcg by mouth daily.     !! - Potential duplicate medications found. Please discuss with provider.    STOP taking these medications     rosuvastatin (CRESTOR) 5 MG tablet        Allergies  Allergen Reactions  . Penicillins Hives    As a child. Tolerates cephalosporins. Has patient had a PCN reaction causing immediate rash, facial/tongue/throat swelling, SOB or lightheadedness with  hypotension: No Has patient had a PCN reaction causing severe rash involving mucus membranes or skin necrosis: No Has patient had a PCN reaction that required hospitalization No Has patient had a PCN reaction occurring within the last 10 years: No If all of the above answers are "NO", then may proceed with Cephalosporin use.  . Statins Other (See Comments)    Muscle cramps  . Sulfa Antibiotics Swelling    angioedema  . Tussionex Pennkinetic Er [Hydrocod Polst-Cpm Polst Er]     "thought she was going to die" Cold sweat Bradycardia Tolerates hydrocodone  . Latex Rash    Local swelling      The results of significant diagnostics from this hospitalization (including imaging, microbiology, ancillary and laboratory) are listed below for reference.    Significant Diagnostic Studies: Ct Abdomen Pelvis W Contrast  Result Date: 04/05/2017 CLINICAL DATA:  Abdominal pain, nausea, vomiting, bloating and cramps. Leukocytosis. Elevated lipase. History of hypertension, diabetes, pancreatitis. EXAM: CT ABDOMEN AND PELVIS WITH CONTRAST TECHNIQUE: Multidetector CT imaging of the abdomen and pelvis was performed using the standard protocol following bolus administration of intravenous contrast. CONTRAST:  100 cc Isovue-300 COMPARISON:  CT abdomen and pelvis July 18, 2016 FINDINGS: LOWER CHEST: Similar bilateral lower lobe atelectasis/scarring and minimal bronchiectasis. Scratch Included heart size is normal. No pericardial effusion. HEPATOBILIARY: Liver and gallbladder are normal. PANCREAS: Enlarged, diffusely edematous pancreas without focal necrosis. No pseudocyst, pancreatic duct dilatation. Stable calcification proximal pancreatic body. SPLEEN: Normal. ADRENALS/URINARY TRACT: Kidneys are orthotopic, demonstrating symmetric enhancement. 3 mm LEFT lower pole nephrolithiasis. No hydronephrosis or solid renal masses. The unopacified ureters are normal in course and caliber. Delayed imaging through the  kidneys demonstrates symmetric prompt contrast excretion within the proximal urinary collecting system. Urinary bladder is partially distended and unremarkable. Normal adrenal glands. STOMACH/BOWEL: Small hiatal hernia. The stomach, small and large bowel are normal in course and caliber without inflammatory changes. Small debris-filled duodenum diverticulum. Moderate amount of retained large bowel stool. Normal appendix. VASCULAR/LYMPHATIC: Aortoiliac vessels are normal in course and caliber. Severe calcific atherosclerosis. No lymphadenopathy by CT size criteria. REPRODUCTIVE: Normal. OTHER: Small amount of ascites and, retroperitoneal free fluid. No rim enhancing fluid collection. No intraperitoneal free air. MUSCULOSKELETAL: Nonacute. Bilateral breast implants. Osteopenia. Old moderate L1 compression fracture. Moderate L5-S1 degenerative disc. Small fat containing umbilical hernia. IMPRESSION: 1. Acute severe pancreatitis without necrosis. Small volume ascites and retroperitoneal free fluid, no abscess. 2. 3 mm nonobstructing LEFT nephrolithiasis. Aortic Atherosclerosis (ICD10-I70.0). Electronically Signed   By: Awilda Metro M.D.   On: 04/05/2017 02:21   Dg Chest Port 1 View  Result Date: 04/07/2017 CLINICAL DATA:  Dyspnea at rest EXAM: PORTABLE CHEST 1 VIEW COMPARISON:  07/20/2016 chest radiograph. FINDINGS: Low lung volumes. Stable cardiomediastinal silhouette with top-normal heart size. No pneumothorax. Small bilateral pleural effusions. Patchy bibasilar lung opacities. No pulmonary edema. IMPRESSION: 1. Small bilateral pleural effusions. 2. Low  lung volumes. Patchy bibasilar lung opacities, which could represent aspiration, atelectasis or pneumonia . Electronically Signed   By: Delbert Phenix M.D.   On: 04/07/2017 18:21    Microbiology: No results found for this or any previous visit (from the past 240 hour(s)).   Labs: Basic Metabolic Panel:  Recent Labs Lab 04/06/17 0406 04/07/17 0453  04/08/17 0527 04/09/17 0811 04/10/17 0500  NA 143 139 135 137 133*  K 4.1 3.7 2.9* 3.7 3.8  CL 114* 109 100* 104 102  CO2 21* 21* 24 25 21*  GLUCOSE 118* 109* 115* 117* 129*  BUN CREATININE 0.94 0.73 0.76 0.82 0.76  CALCIUM 8.0* 8.1* 7.9* 8.5* 8.4*   Liver Function Tests:  Recent Labs Lab 04/04/17 2244 04/05/17 0451 04/06/17 0406 04/07/17 0453  AST ALT 14 14 11* 13*  ALKPHOS 49 47 42 49  BILITOT 0.5 0.6 0.6 0.8  PROT 7.6 6.5 5.9* 5.8*  ALBUMIN 4.2 3.7 3.1* 2.8*    Recent Labs Lab 04/04/17 2244 04/06/17 0406 04/07/17 0453  LIPASE 323* 501* 124*   No results for input(s): AMMONIA in the last 168 hours. CBC:  Recent Labs Lab 04/05/17 0451 04/06/17 0406 04/07/17 0453 04/08/17 0527 04/09/17 0811  WBC 15.9* 21.1* 22.8* 18.0* 18.0*  HGB 13.1 13.5 12.5 11.7* 11.9*  HCT 39.0 41.1 37.5 34.1* 34.3*  MCV 98.0 100.2* 97.9 95.0 94.5  PLT 216 182 137* 144* 179   Cardiac Enzymes: No results for input(s): CKTOTAL, CKMB, CKMBINDEX, TROPONINI in the last 168 hours. BNP: BNP (last 3 results) No results for input(s): BNP in the last 8760 hours.  ProBNP (last 3 results) No results for input(s): PROBNP in the last 8760 hours.  CBG:  Recent Labs Lab 04/09/17 1150 04/09/17 1614 04/09/17 2000 04/10/17 0037 04/10/17 0758  GLUCAP 170* 112* 190* 122* 108*    Signed:  Penny Pia MD.  [facility] 04/10/2017, 10:42 AM  Addendum: pt reports tolerating diet, less abdominal discomfort, and desire to go home.

## 2017-05-15 ENCOUNTER — Other Ambulatory Visit: Payer: Self-pay | Admitting: Gastroenterology

## 2017-05-17 ENCOUNTER — Other Ambulatory Visit: Payer: Self-pay | Admitting: Gastroenterology

## 2017-05-17 DIAGNOSIS — K8500 Idiopathic acute pancreatitis without necrosis or infection: Secondary | ICD-10-CM

## 2017-06-19 ENCOUNTER — Ambulatory Visit
Admission: RE | Admit: 2017-06-19 | Discharge: 2017-06-19 | Disposition: A | Discharge status: Home / Self Care | Payer: Medicare Other | Source: Ambulatory Visit | Attending: Gastroenterology | Admitting: Gastroenterology

## 2017-06-19 DIAGNOSIS — K8500 Idiopathic acute pancreatitis without necrosis or infection: Secondary | ICD-10-CM

## 2017-06-19 MED ORDER — GADOBENATE DIMEGLUMINE 529 MG/ML IV SOLN
13.0000 mL | Freq: Once | INTRAVENOUS | Status: AC | PRN
  Administered 2017-06-19: 13 mL via INTRAVENOUS

## 2017-08-07 ENCOUNTER — Other Ambulatory Visit: Payer: Self-pay | Admitting: Surgery

## 2017-08-21 NOTE — Pre-Procedure Instructions (Signed)
Robin Brady  08/21/2017      [id number] - [place], [place] - [address] [place] [address] Phone: [phone] Fax: [phone]    Your procedure is scheduled on Wed. Feb. 27  Report to [facility] Admitting at 9:15 A.M.  Call this number if you have problems the morning of surgery:  [phone]   Remember:  Do not eat food or drink liquids after midnight on Tues. Feb. 26   Take these medicines the morning of surgery with A SIP OF WATER : metoprolol succinate (toprol-xl), eye drops,              7 days prior to surgery STOP taking any Aspirin(unless otherwise instructed by your surgeon), Aleve, Naproxen, Ibuprofen, Motrin, Advil, Goody's, BC's, all herbal medications, fish oil, and all vitamins                   How to Manage Your Diabetes Before and After Surgery  Why is it important to control my blood sugar before and after surgery? . Improving blood sugar levels before and after surgery helps healing and can limit problems. . A way of improving blood sugar control is eating a healthy diet by: o  Eating less sugar and carbohydrates o  Increasing activity/exercise o  Talking with your doctor about reaching your blood sugar goals . High blood sugars (greater than 180 mg/dL) can raise your risk of infections and slow your recovery, so you will need to focus on controlling your diabetes during the weeks before surgery. . Make sure that the doctor who takes care of your diabetes knows about your planned surgery including the date and location.  How do I manage my blood sugar before surgery? . Check your blood sugar at least 4 times a day, starting 2 days before surgery, to make sure that the level is not too high or low. o Check your blood sugar the morning of your surgery when you wake up and every 2 hours until you get to the Short Stay unit. . If your blood sugar is less  than 70 mg/dL, you will need to treat for low blood sugar: o Do not take insulin. o Treat a low blood sugar (less than 70 mg/dL) with  cup of clear juice (cranberry or apple), 4 glucose tablets, OR glucose gel. Recheck blood sugar in 15 minutes after treatment (to make sure it is greater than 70 mg/dL). If your blood sugar is not greater than 70 mg/dL on recheck, call [phone] o  for further instructions. . Report your blood sugar to the short stay nurse when you get to Short Stay.  . If you are admitted to the hospital after surgery: o Your blood sugar will be checked by the staff and you will probably be given insulin after surgery (instead of oral diabetes medicines) to make sure you have good blood sugar levels. o The goal for blood sugar control after surgery is 80-180 mg/dL.       WHAT DO I DO ABOUT MY DIABETES MEDICATION?   Marland Kitchen Do not take oral diabetes medicines (pills) the morning of surgery.         Do not wear jewelry, make-up or nail polish.  Do not wear lotions, powders, or perfumes, or deodorant.  Do not shave 48 hours prior to surgery.  Men may shave face and neck.  Do not bring valuables to the hospital.  [facility] is not responsible for any belongings or valuables.  Contacts, dentures or bridgework may not be worn into surgery.  Leave your suitcase in the car.  After surgery it may be brought to your room.  For patients admitted to the hospital, discharge time will be determined by your treatment team.  Patients discharged the day of surgery will not be allowed to drive home.    Special instructions:   - Preparing For Surgery  Before surgery, you can play an important role. Because skin is not sterile, your skin needs to be as free of germs as possible. You can reduce the number of germs on your skin by washing with CHG (chlorahexidine gluconate) Soap before surgery.  CHG is an antiseptic cleaner which kills germs and bonds with the skin to  continue killing germs even after washing.  Please do not use if you have an allergy to CHG or antibacterial soaps. If your skin becomes reddened/irritated stop using the CHG.  Do not shave (including legs and underarms) for at least 48 hours prior to first CHG shower. It is OK to shave your face.  Please follow these instructions carefully.   1. Shower the NIGHT BEFORE SURGERY and the MORNING OF SURGERY with CHG.   2. If you chose to wash your hair, wash your hair first as usual with your normal shampoo.  3. After you shampoo, rinse your hair and body thoroughly to remove the shampoo.  4. Use CHG as you would any other liquid soap. You can apply CHG directly to the skin and wash gently with a scrungie or a clean washcloth.   5. Apply the CHG Soap to your body ONLY FROM THE NECK DOWN.  Do not use on open wounds or open sores. Avoid contact with your eyes, ears, mouth and genitals (private parts). Wash Face and genitals (private parts)  with your normal soap.  6. Wash thoroughly, paying special attention to the area where your surgery will be performed.  7. Thoroughly rinse your body with warm water from the neck down.  8. DO NOT shower/wash with your normal soap after using and rinsing off the CHG Soap.  9. Pat yourself dry with a CLEAN TOWEL.  10. Wear CLEAN PAJAMAS to bed the night before surgery, wear comfortable clothes the morning of surgery  11. Place CLEAN SHEETS on your bed the night of your first shower and DO NOT SLEEP WITH PETS.    Day of Surgery: Do not apply any deodorants/lotions. Please wear clean clothes to the hospital/surgery center.      Please read over the following fact sheets that you were given. Coughing and Deep Breathing and Surgical Site Infection Prevention

## 2017-08-22 ENCOUNTER — Other Ambulatory Visit: Payer: Self-pay

## 2017-08-22 ENCOUNTER — Encounter (HOSPITAL_COMMUNITY): Payer: Self-pay

## 2017-08-22 ENCOUNTER — Encounter (HOSPITAL_COMMUNITY)
Admission: RE | Admit: 2017-08-22 | Discharge: 2017-08-22 | Disposition: A | Discharge status: Home / Self Care | Payer: Medicare Other | Source: Ambulatory Visit | Attending: Surgery | Admitting: Surgery

## 2017-08-22 DIAGNOSIS — Z7984 Long term (current) use of oral hypoglycemic drugs: Secondary | ICD-10-CM | POA: Diagnosis not present

## 2017-08-22 DIAGNOSIS — Z01812 Encounter for preprocedural laboratory examination: Secondary | ICD-10-CM | POA: Diagnosis present

## 2017-08-22 DIAGNOSIS — I10 Essential (primary) hypertension: Secondary | ICD-10-CM | POA: Diagnosis not present

## 2017-08-22 DIAGNOSIS — D649 Anemia, unspecified: Secondary | ICD-10-CM | POA: Diagnosis not present

## 2017-08-22 DIAGNOSIS — E119 Type 2 diabetes mellitus without complications: Secondary | ICD-10-CM | POA: Insufficient documentation

## 2017-08-22 HISTORY — DX: Nontoxic single thyroid nodule: E04.1

## 2017-08-22 HISTORY — DX: Unspecified osteoarthritis, unspecified site: M19.90

## 2017-08-22 HISTORY — DX: Cardiac arrhythmia, unspecified: I49.9

## 2017-08-22 LAB — HEMOGLOBIN A1C
Hgb A1c MFr Bld: 6.3 % — ABNORMAL HIGH (ref 4.8–5.6)
Mean Plasma Glucose: 134.11 mg/dL

## 2017-08-22 LAB — BASIC METABOLIC PANEL
Anion gap: 11 (ref 5–15)
BUN: 18 mg/dL (ref 6–20)
CO2: 25 mmol/L (ref 22–32)
Calcium: 10 mg/dL (ref 8.9–10.3)
Chloride: 106 mmol/L (ref 101–111)
Creatinine, Ser: 1.21 mg/dL — ABNORMAL HIGH (ref 0.44–1.00)
GFR calc Af Amer: 51 mL/min — ABNORMAL LOW (ref >60)
GFR calc non Af Amer: 44 mL/min — ABNORMAL LOW (ref >60)
Glucose, Bld: 122 mg/dL — ABNORMAL HIGH (ref 65–99)
Potassium: 4.7 mmol/L (ref 3.5–5.1)
Sodium: 142 mmol/L (ref 135–145)

## 2017-08-22 LAB — CBC
HCT: 40.8 % (ref 36.0–46.0)
Hemoglobin: 13.6 g/dL (ref 12.0–15.0)
MCH: 32.5 pg (ref 26.0–34.0)
MCHC: 33.3 g/dL (ref 30.0–36.0)
MCV: 97.6 fL (ref 78.0–100.0)
Platelets: 253 10*3/uL (ref 150–400)
RBC: 4.18 MIL/uL (ref 3.87–5.11)
RDW: 13.2 % (ref 11.5–15.5)
WBC: 7.8 10*3/uL (ref 4.0–10.5)

## 2017-08-22 LAB — GLUCOSE, CAPILLARY: Glucose-Capillary: 118 mg/dL — ABNORMAL HIGH (ref 65–99)

## 2017-08-27 NOTE — H&P (Signed)
Robin Brady Documented: 08/07/2017 3:23 PM Location: [facility] Patient #: [id number] DOB: 01/25/1946 Married / Language: English / Race: White Female   History of Present Illness (Douglas A. Magnus IvanBlackman MD; 08/07/2017 3:43 PM) The patient is a 72 year old female who presents with acute pancreatitis. This is a pleasant 72 year old female referred by Dr. Willis ModenaWilliam Outlaw for evaluation of recurrent pancreatitis. She is an otherwise very healthy female who has had 2 bouts of pancreatitis this past year in January and in October. Both required hospitalizations and then one in October was quite severe. She has had a complete and thorough workup by gastroenterology which has been negative for a cause of the pancreatitis. She does have pancreatic disease. She has had a negative MRCP, endoscopic ultrasound, regular ultrasound, and CT scan for gallstones. As she has no other cause for her pancreatitis, it is still suspected that she may have very tiny stones or thick sludge that this caused the recurrent bouts. Again, she does not drink alcohol and has had no significant change in medications. She has no previous distant remote history of pancreatitis. Currently, she feels well and has gotten over the most recent attack. She is moving her bowels well and eating well   Past Surgical History Marcelino Duster(Michelle R. Brooks, CMA; 08/07/2017 3:23 PM) Breast Augmentation  Bilateral. Breast Biopsy  Bilateral. Cataract Surgery  Bilateral. Tonsillectomy   Diagnostic Studies History Marcelino Duster(Michelle R. Shon BatonBrooks, CMA; 08/07/2017 3:23 PM) Colonoscopy  5-10 years ago Mammogram  within last year Pap Smear  >5 years ago  Allergies Marcelino Duster(Michelle R. Brooks, CMA; 08/07/2017 3:25 PM) Penicillin G Pot in Dextrose *PENICILLINS*  Sulfa 10 *OPHTHALMIC AGENTS*  Tussionex Pennkinetic ER *COUGH/COLD/ALLERGY*   Medication History (Michelle R. Brooks, CMA; 08/07/2017 3:27 PM) MetFORMIN HCl ER (500MG  Tablet ER 24HR, Oral)  Active. Metoprolol Succinate ER (25MG  Tablet ER 24HR, Oral) Active. Aspirin (81MG  Tablet, Oral) Active. Calcium Carbonate-Vit D-Min (Oral) Specific strength unknown - Active. Vitamin D (2000UNIT Capsule, Oral) Active. Metrogel (0.75% Gel, External) Active. Omega 3 (1000MG  Capsule, Oral) Active. MiraLax (Oral) Active. Vitamin B-12 (Oral) Specific strength unknown - Active. Medications Reconciled  Social History Marcelino Duster(Michelle R. Brooks, CMA; 08/07/2017 3:23 PM) Alcohol use  Recently quit alcohol use. Caffeine use  Tea. No drug use  Tobacco use  Current every day smoker.  Family History Marcelino Duster(Michelle R. Shon BatonBrooks, CMA; 08/07/2017 3:23 PM) Arthritis  Father. Breast Cancer  Family Members In General. Colon Cancer  Family Members In General. Diabetes Mellitus  Brother, Father. Heart Disease  Family Members In General, Father. Heart disease in female family member before age 73  Hypertension  Brother. Ischemic Bowel Disease  Brother.  Pregnancy / Birth History Marcelino Duster(Michelle R. Shon BatonBrooks, CMA; 08/07/2017 3:23 PM) Age at menarche  10 years. Age of menopause  51-55 Contraceptive History  Oral contraceptives. Gravida  2 Length (months) of breastfeeding  3-6 Maternal age  73-25 Para  2  Other Problems Marcelino Duster(Michelle R. Brooks, CMA; 08/07/2017 3:23 PM) Arthritis  Back Pain  Diabetes Mellitus  Hypercholesterolemia  Pancreatitis     Review of Systems [facility](Michelle R. Brooks CMA; 08/07/2017 3:23 PM) General Not Present- Appetite Loss, Chills, Fatigue, Fever, Night Sweats, Weight Gain and Weight Loss. Skin Not Present- Change in Wart/Mole, Dryness, Hives, Jaundice, New Lesions, Non-Healing Wounds, Rash and Ulcer. HEENT Present- Visual Disturbances. Not Present- Earache, Hearing Loss, Hoarseness, Nose Bleed, Oral Ulcers, Ringing in the Ears, Seasonal Allergies, Sinus Pain, Sore Throat, Wears glasses/contact lenses and Yellow Eyes. Respiratory Present- Chronic Cough. Not Present-  Bloody  sputum, Difficulty Breathing, Snoring and Wheezing. Breast Not Present- Breast Mass, Breast Pain, Nipple Discharge and Skin Changes. Cardiovascular Present- Palpitations. Not Present- Chest Pain, Difficulty Breathing Lying Down, Leg Cramps, Rapid Heart Rate, Shortness of Breath and Swelling of Extremities. Gastrointestinal Present- Constipation. Not Present- Abdominal Pain, Bloating, Bloody Stool, Change in Bowel Habits, Chronic diarrhea, Difficulty Swallowing, Excessive gas, Gets full quickly at meals, Hemorrhoids, Indigestion, Nausea, Rectal Pain and Vomiting. Female Genitourinary Not Present- Frequency, Nocturia, Painful Urination, Pelvic Pain and Urgency. Musculoskeletal Present- Back Pain and Joint Pain. Not Present- Joint Stiffness, Muscle Pain, Muscle Weakness and Swelling of Extremities. Neurological Not Present- Decreased Memory, Fainting, Headaches, Numbness, Seizures, Tingling, Tremor, Trouble walking and Weakness. Psychiatric Not Present- Anxiety, Bipolar, Change in Sleep Pattern, Depression, Fearful and Frequent crying. Hematology Not Present- Blood Thinners, Easy Bruising, Excessive bleeding, Gland problems, HIV and Persistent Infections.  Vitals [employer] R. Brooks CMA; 08/07/2017 3:23 PM) 08/07/2017 3:23 PM Weight: 151.5 lb Height: 64in Body Surface Area: 1.74 m Body Mass Index: 26 kg/m  BP: 122/72 (Sitting, Left Arm, Standard)       Physical Exam (Douglas A. Magnus Ivan MD; 08/07/2017 3:44 PM) General Mental Status-Alert. General Appearance-Consistent with stated age. Hydration-Well hydrated. Voice-Normal.  Head and Neck Head-normocephalic, atraumatic with no lesions or palpable masses. Trachea-midline. Thyroid Gland Characteristics - normal size and consistency.  Eye Eyeball - Bilateral-Extraocular movements intact. Sclera/Conjunctiva - Bilateral-No scleral icterus.  Chest and Lung Exam Chest and lung exam reveals -quiet, even and easy  respiratory effort with no use of accessory muscles and on auscultation, normal breath sounds, no adventitious sounds and normal vocal resonance. Inspection Chest Wall - Normal. Back - normal.  Breast Breast - Left-Symmetric, Non Tender, No Biopsy scars, no Dimpling, No Inflammation, No Lumpectomy scars, No Mastectomy scars, No Peau d' Orange. Breast - Right-Symmetric, Non Tender, No Biopsy scars, no Dimpling, No Inflammation, No Lumpectomy scars, No Mastectomy scars, No Peau d' Orange. Breast Lump-No Palpable Breast Mass.  Cardiovascular Cardiovascular examination reveals -normal heart sounds, regular rate and rhythm with no murmurs and normal pedal pulses bilaterally.  Abdomen Inspection Inspection of the abdomen reveals - No Hernias. Skin - Scar - no surgical scars. Palpation/Percussion Palpation and Percussion of the abdomen reveal - Soft, Non Tender, No Rebound tenderness, No Rigidity (guarding) and No hepatosplenomegaly. Auscultation Auscultation of the abdomen reveals - Bowel sounds normal.  Neurologic - Did not examine.  Musculoskeletal - Did not examine.  Lymphatic - Did not examine.    Assessment & Plan (Douglas A. Magnus Ivan MD; 08/07/2017 3:45 PM) PANCREATITIS, RECURRENT (K85.90) Impression: I had a long discussion with patient regarding pancreatitis. We discussed the possibility that she still could have tiny gallstones or gallbladder sludge causing her symptoms. I have had patients in the past with similar presentation as well. We discussed proceeding with a laparoscopic cholecystectomy and cholangiogram versus continued expectant management. Because she was so sick from the last bout of pancreatitis, she wishes to proceed with a laparoscopic cholecystectomy and cholangiogram. We discussed the risks of surgery which includes but is not limited to bleeding, infection, injury to surrounding structures, the need to convert to an open procedure, bile duct injury, bile  leak, recurrent pancreatitis, cardiopulmonary issues, etc. We also discussed postoperative recovery. She understands and wished to proceed with surgery which will be scheduled.

## 2017-08-28 ENCOUNTER — Ambulatory Visit (HOSPITAL_COMMUNITY): Payer: Medicare Other

## 2017-08-28 ENCOUNTER — Ambulatory Visit (HOSPITAL_COMMUNITY): Payer: Medicare Other | Admitting: Anesthesiology

## 2017-08-28 ENCOUNTER — Ambulatory Visit (HOSPITAL_COMMUNITY)
Admission: RE | Admit: 2017-08-28 | Discharge: 2017-08-28 | Disposition: A | Discharge status: Home / Self Care | Payer: Medicare Other | Source: Ambulatory Visit | Attending: Surgery | Admitting: Surgery

## 2017-08-28 ENCOUNTER — Encounter (HOSPITAL_COMMUNITY)
Admission: RE | Disposition: A | Discharge status: Home / Self Care | Payer: Self-pay | Source: Ambulatory Visit | Attending: Surgery

## 2017-08-28 ENCOUNTER — Encounter (HOSPITAL_COMMUNITY): Payer: Self-pay | Admitting: Urology

## 2017-08-28 DIAGNOSIS — K861 Other chronic pancreatitis: Secondary | ICD-10-CM | POA: Insufficient documentation

## 2017-08-28 DIAGNOSIS — E7800 Pure hypercholesterolemia, unspecified: Secondary | ICD-10-CM | POA: Diagnosis not present

## 2017-08-28 DIAGNOSIS — Z79899 Other long term (current) drug therapy: Secondary | ICD-10-CM | POA: Diagnosis not present

## 2017-08-28 DIAGNOSIS — Z7984 Long term (current) use of oral hypoglycemic drugs: Secondary | ICD-10-CM | POA: Diagnosis not present

## 2017-08-28 DIAGNOSIS — Z419 Encounter for procedure for purposes other than remedying health state, unspecified: Secondary | ICD-10-CM

## 2017-08-28 DIAGNOSIS — I10 Essential (primary) hypertension: Secondary | ICD-10-CM | POA: Diagnosis not present

## 2017-08-28 DIAGNOSIS — E119 Type 2 diabetes mellitus without complications: Secondary | ICD-10-CM | POA: Diagnosis not present

## 2017-08-28 DIAGNOSIS — F17200 Nicotine dependence, unspecified, uncomplicated: Secondary | ICD-10-CM | POA: Insufficient documentation

## 2017-08-28 DIAGNOSIS — K811 Chronic cholecystitis: Secondary | ICD-10-CM | POA: Insufficient documentation

## 2017-08-28 DIAGNOSIS — I700 Atherosclerosis of aorta: Secondary | ICD-10-CM | POA: Insufficient documentation

## 2017-08-28 DIAGNOSIS — Z7982 Long term (current) use of aspirin: Secondary | ICD-10-CM | POA: Diagnosis not present

## 2017-08-28 HISTORY — PX: CHOLECYSTECTOMY: SHX55

## 2017-08-28 LAB — GLUCOSE, CAPILLARY
Glucose-Capillary: 95 mg/dL (ref 65–99)
Glucose-Capillary: 146 mg/dL — ABNORMAL HIGH (ref 65–99)

## 2017-08-28 SURGERY — LAPAROSCOPIC CHOLECYSTECTOMY WITH INTRAOPERATIVE CHOLANGIOGRAM
Anesthesia: General | Site: Abdomen

## 2017-08-28 MED ORDER — SCOPOLAMINE 1 MG/3DAYS TD PT72
1.0000 | MEDICATED_PATCH | TRANSDERMAL | Status: DC
  Administered 2017-08-28: 1.5 mg via TRANSDERMAL

## 2017-08-28 MED ORDER — PROPOFOL 10 MG/ML IV BOLUS
INTRAVENOUS | Status: DC | PRN
  Administered 2017-08-28: 150 mg via INTRAVENOUS

## 2017-08-28 MED ORDER — FENTANYL CITRATE (PF) 100 MCG/2ML IJ SOLN
INTRAMUSCULAR | Status: AC
  Filled 2017-08-28: qty 2

## 2017-08-28 MED ORDER — OXYCODONE HCL 5 MG PO TABS: 5.0000 mg | ORAL_TABLET | Freq: Once | ORAL | Status: DC | PRN

## 2017-08-28 MED ORDER — CHLORHEXIDINE GLUCONATE CLOTH 2 % EX PADS: 6.0000 | MEDICATED_PAD | Freq: Once | CUTANEOUS | Status: DC

## 2017-08-28 MED ORDER — DEXAMETHASONE SODIUM PHOSPHATE 10 MG/ML IJ SOLN
INTRAMUSCULAR | Status: DC | PRN
  Administered 2017-08-28: 10 mg via INTRAVENOUS

## 2017-08-28 MED ORDER — SODIUM CHLORIDE 0.9 % IR SOLN
Status: DC | PRN
  Administered 2017-08-28: 1000 mL

## 2017-08-28 MED ORDER — MIDAZOLAM HCL 5 MG/5ML IJ SOLN
INTRAMUSCULAR | Status: DC | PRN
  Administered 2017-08-28: 1 mg via INTRAVENOUS

## 2017-08-28 MED ORDER — OXYCODONE HCL 5 MG PO TABS: 5.0000 mg | ORAL_TABLET | Freq: Four times a day (QID) | ORAL | 0 refills | Status: DC | PRN

## 2017-08-28 MED ORDER — LIDOCAINE HCL (CARDIAC) 20 MG/ML IV SOLN
INTRAVENOUS | Status: DC | PRN
  Administered 2017-08-28: 60 mg via INTRAVENOUS

## 2017-08-28 MED ORDER — SUGAMMADEX SODIUM 200 MG/2ML IV SOLN
INTRAVENOUS | Status: DC | PRN
  Administered 2017-08-28: 150 mg via INTRAVENOUS

## 2017-08-28 MED ORDER — OXYCODONE HCL 5 MG/5ML PO SOLN: 5.0000 mg | Freq: Once | ORAL | Status: DC | PRN

## 2017-08-28 MED ORDER — SODIUM CHLORIDE 0.9 % IV SOLN
INTRAVENOUS | Status: DC | PRN
  Administered 2017-08-28: 8 mL

## 2017-08-28 MED ORDER — CIPROFLOXACIN IN D5W 400 MG/200ML IV SOLN
400.0000 mg | INTRAVENOUS | Status: AC
  Administered 2017-08-28: 400 mg via INTRAVENOUS

## 2017-08-28 MED ORDER — MIDAZOLAM HCL 2 MG/2ML IJ SOLN
INTRAMUSCULAR | Status: AC
  Filled 2017-08-28: qty 2

## 2017-08-28 MED ORDER — ROCURONIUM BROMIDE 100 MG/10ML IV SOLN
INTRAVENOUS | Status: DC | PRN
  Administered 2017-08-28: 50 mg via INTRAVENOUS

## 2017-08-28 MED ORDER — BUPIVACAINE-EPINEPHRINE (PF) 0.5% -1:200000 IJ SOLN
INTRAMUSCULAR | Status: AC
  Filled 2017-08-28: qty 30

## 2017-08-28 MED ORDER — LIDOCAINE 2% (20 MG/ML) 5 ML SYRINGE
INTRAMUSCULAR | Status: AC
  Filled 2017-08-28: qty 5

## 2017-08-28 MED ORDER — FENTANYL CITRATE (PF) 250 MCG/5ML IJ SOLN
INTRAMUSCULAR | Status: AC
  Filled 2017-08-28: qty 5

## 2017-08-28 MED ORDER — ONDANSETRON HCL 4 MG/2ML IJ SOLN
INTRAMUSCULAR | Status: DC | PRN
  Administered 2017-08-28: 4 mg via INTRAVENOUS

## 2017-08-28 MED ORDER — LACTATED RINGERS IV SOLN
INTRAVENOUS | Status: DC
  Administered 2017-08-28 (×2): via INTRAVENOUS

## 2017-08-28 MED ORDER — ARTIFICIAL TEARS OPHTHALMIC OINT
TOPICAL_OINTMENT | OPHTHALMIC | Status: AC
  Filled 2017-08-28: qty 3.5

## 2017-08-28 MED ORDER — ONDANSETRON HCL 4 MG/2ML IJ SOLN: 4.0000 mg | Freq: Once | INTRAMUSCULAR | Status: DC | PRN

## 2017-08-28 MED ORDER — PROPOFOL 10 MG/ML IV BOLUS
INTRAVENOUS | Status: AC
  Filled 2017-08-28: qty 20

## 2017-08-28 MED ORDER — FENTANYL CITRATE (PF) 100 MCG/2ML IJ SOLN
25.0000 ug | INTRAMUSCULAR | Status: DC | PRN
  Administered 2017-08-28: 25 ug via INTRAVENOUS

## 2017-08-28 MED ORDER — FENTANYL CITRATE (PF) 100 MCG/2ML IJ SOLN
INTRAMUSCULAR | Status: DC | PRN
  Administered 2017-08-28: 100 ug via INTRAVENOUS
  Administered 2017-08-28: 50 ug via INTRAVENOUS

## 2017-08-28 MED ORDER — IOPAMIDOL (ISOVUE-300) INJECTION 61%
INTRAVENOUS | Status: AC
  Filled 2017-08-28: qty 50

## 2017-08-28 MED ORDER — SCOPOLAMINE 1 MG/3DAYS TD PT72
MEDICATED_PATCH | TRANSDERMAL | Status: AC
  Filled 2017-08-28: qty 1

## 2017-08-28 MED ORDER — 0.9 % SODIUM CHLORIDE (POUR BTL) OPTIME
TOPICAL | Status: DC | PRN
  Administered 2017-08-28: 1000 mL

## 2017-08-28 MED ORDER — SUGAMMADEX SODIUM 200 MG/2ML IV SOLN
INTRAVENOUS | Status: AC
  Filled 2017-08-28: qty 2

## 2017-08-28 MED ORDER — PHENYLEPHRINE 40 MCG/ML (10ML) SYRINGE FOR IV PUSH (FOR BLOOD PRESSURE SUPPORT)
PREFILLED_SYRINGE | INTRAVENOUS | Status: AC
  Filled 2017-08-28: qty 10

## 2017-08-28 MED ORDER — CIPROFLOXACIN IN D5W 400 MG/200ML IV SOLN
INTRAVENOUS | Status: AC
  Filled 2017-08-28: qty 200

## 2017-08-28 MED ORDER — ONDANSETRON HCL 4 MG/2ML IJ SOLN
INTRAMUSCULAR | Status: AC
  Filled 2017-08-28: qty 2

## 2017-08-28 MED ORDER — BUPIVACAINE-EPINEPHRINE (PF) 0.5% -1:200000 IJ SOLN
INTRAMUSCULAR | Status: DC | PRN
  Administered 2017-08-28: 20 mL via PERINEURAL

## 2017-08-28 SURGICAL SUPPLY — 44 items
ADH SKN CLS APL DERMABOND .7 (GAUZE/BANDAGES/DRESSINGS) ×1
APPLIER CLIP 5 13 M/L LIGAMAX5 (MISCELLANEOUS) ×2
APR CLP MED LRG 5 ANG JAW (MISCELLANEOUS) ×1
BAG SPEC RTRVL LRG 6X4 10 (ENDOMECHANICALS) ×1
BLADE CLIPPER SURG (BLADE) IMPLANT
CANISTER SUCT 3000ML PPV (MISCELLANEOUS) ×2 IMPLANT
CHLORAPREP W/TINT 26ML (MISCELLANEOUS) ×2 IMPLANT
CLIP APPLIE 5 13 M/L LIGAMAX5 (MISCELLANEOUS) ×1 IMPLANT
COVER MAYO STAND STRL (DRAPES) ×1 IMPLANT
COVER SURGICAL LIGHT HANDLE (MISCELLANEOUS) ×2 IMPLANT
DERMABOND ADVANCED (GAUZE/BANDAGES/DRESSINGS) ×1
DERMABOND ADVANCED .7 DNX12 (GAUZE/BANDAGES/DRESSINGS) ×1 IMPLANT
DRAPE C-ARM 42X72 X-RAY (DRAPES) ×1 IMPLANT
ELECT REM PT RETURN 9FT ADLT (ELECTROSURGICAL) ×2
ELECTRODE REM PT RTRN 9FT ADLT (ELECTROSURGICAL) ×1 IMPLANT
GLOVE BIOGEL PI IND STRL 7.5 (GLOVE) IMPLANT
GLOVE BIOGEL PI INDICATOR 7.5 (GLOVE) ×1
GLOVE SURG SIGNA 7.5 PF LTX (GLOVE) ×1 IMPLANT
GLOVE SURG SS PI 6.5 STRL IVOR (GLOVE) ×1 IMPLANT
GLOVE SURG SS PI 7.5 STRL IVOR (GLOVE) ×2 IMPLANT
GOWN STRL REUS W/ TWL LRG LVL3 (GOWN DISPOSABLE) ×2 IMPLANT
GOWN STRL REUS W/ TWL XL LVL3 (GOWN DISPOSABLE) ×1 IMPLANT
GOWN STRL REUS W/TWL LRG LVL3 (GOWN DISPOSABLE) ×4
GOWN STRL REUS W/TWL XL LVL3 (GOWN DISPOSABLE) ×2
KIT BASIN OR (CUSTOM PROCEDURE TRAY) ×2 IMPLANT
KIT ROOM TURNOVER OR (KITS) ×2 IMPLANT
NDL HYPO 25GX1X1/2 BEV (NEEDLE) IMPLANT
NEEDLE HYPO 25GX1X1/2 BEV (NEEDLE) ×2 IMPLANT
NS IRRIG 1000ML POUR BTL (IV SOLUTION) ×2 IMPLANT
PAD ARMBOARD 7.5X6 YLW CONV (MISCELLANEOUS) ×2 IMPLANT
POUCH SPECIMEN RETRIEVAL 10MM (ENDOMECHANICALS) ×2 IMPLANT
SCISSORS LAP 5X35 DISP (ENDOMECHANICALS) ×2 IMPLANT
SET CHOLANGIOGRAPH 5 50 .035 (SET/KITS/TRAYS/PACK) ×1 IMPLANT
SET IRRIG TUBING LAPAROSCOPIC (IRRIGATION / IRRIGATOR) ×2 IMPLANT
SLEEVE ENDOPATH XCEL 5M (ENDOMECHANICALS) ×4 IMPLANT
SPECIMEN JAR SMALL (MISCELLANEOUS) ×2 IMPLANT
SUT MNCRL AB 4-0 PS2 18 (SUTURE) ×2 IMPLANT
TOWEL OR 17X24 6PK STRL BLUE (TOWEL DISPOSABLE) ×2 IMPLANT
TOWEL OR 17X26 10 PK STRL BLUE (TOWEL DISPOSABLE) ×2 IMPLANT
TRAY LAPAROSCOPIC MC (CUSTOM PROCEDURE TRAY) ×2 IMPLANT
TROCAR XCEL BLUNT TIP 100MML (ENDOMECHANICALS) ×2 IMPLANT
TROCAR XCEL NON-BLD 5MMX100MML (ENDOMECHANICALS) ×2 IMPLANT
TUBING INSUFFLATION (TUBING) ×2 IMPLANT
WATER STERILE IRR 1000ML POUR (IV SOLUTION) ×2 IMPLANT

## 2017-08-28 NOTE — Interval H&P Note (Signed)
History and Physical Interval Note:no change in H and P  08/28/2017 10:38 AM  Baker Pieriniachel S Winburn  has presented today for surgery, with the diagnosis of recurrent pancreatitis  The various methods of treatment have been discussed with the patient and family. After consideration of risks, benefits and other options for treatment, the patient has consented to  Procedure(s): LAPAROSCOPIC CHOLECYSTECTOMY WITH INTRAOPERATIVE CHOLANGIOGRAM (N/A) as a surgical intervention .  The patient's history has been reviewed, patient examined, no change in status, stable for surgery.  I have reviewed the patient's chart and labs.  Questions were answered to the patient's satisfaction.     BLACKMAN,DOUGLAS A

## 2017-08-28 NOTE — Transfer of Care (Signed)
Immediate Anesthesia Transfer of Care Note  Patient: Robin Brady  Procedure(s) Performed: LAPAROSCOPIC CHOLECYSTECTOMY WITH INTRAOPERATIVE CHOLANGIOGRAM (N/A Abdomen)  Patient Location: PACU  Anesthesia Type:General  Level of Consciousness: awake, alert  and oriented  Airway & Oxygen Therapy: Patient Spontanous Breathing and Patient connected to nasal cannula oxygen  Post-op Assessment: Report given to RN, Post -op Vital signs reviewed and stable and Patient moving all extremities  Post vital signs: Reviewed and stable  Last Vitals:  Vitals:   08/28/17 0936  BP: (!) 141/66  Pulse: (!) 55  Resp: 18  Temp: (!) 36.4 C  SpO2: 100%    Last Pain:  Vitals:   08/28/17 0936  TempSrc: Oral         Complications: No apparent anesthesia complications

## 2017-08-28 NOTE — Anesthesia Postprocedure Evaluation (Signed)
Anesthesia Post Note  Patient: Robin Brady  Procedure(s) Performed: LAPAROSCOPIC CHOLECYSTECTOMY WITH INTRAOPERATIVE CHOLANGIOGRAM (N/A Abdomen)     Patient location during evaluation: PACU Anesthesia Type: General Level of consciousness: awake and alert Pain management: pain level controlled Vital Signs Assessment: post-procedure vital signs reviewed and stable Respiratory status: spontaneous breathing, nonlabored ventilation and respiratory function stable Cardiovascular status: blood pressure returned to baseline and stable Postop Assessment: no apparent nausea or vomiting Anesthetic complications: no    Last Vitals:  Vitals:   08/28/17 1254 08/28/17 1302  BP: (!) 161/59 140/63  Pulse:  (!) 53  Resp:    Temp: (!) 36.3 C   SpO2:  98%    Last Pain:  Vitals:   08/28/17 1302  TempSrc:   PainSc: 3                  Beryle Lathehomas E Brock

## 2017-08-28 NOTE — Op Note (Signed)
Laparoscopic Cholecystectomy with IOC Procedure Note    Pre-operative Diagnosis: recurrent pancreatitis  Post-operative Diagnosis: Same  Surgeon: Abigail MiyamotoBLACKMAN,DOUGLAS A   Assistants: 0  Anesthesia: General endotracheal anesthesia  ASA Class: 2  Procedure Details  The patient was seen again in the Holding Room. The risks, benefits, complications, treatment options, and expected outcomes were discussed with the patient. The possibilities of reaction to medication, pulmonary aspiration, perforation of viscus, bleeding, recurrent infection, finding a normal gallbladder, the need for additional procedures, failure to diagnose a condition, the possible need to convert to an open procedure, and creating a complication requiring transfusion or operation were discussed with the patient. The likelihood of improving the patient's symptoms with return to their baseline status is good.  The patient and/or family concurred with the proposed plan, giving informed consent. The site of surgery properly noted. The patient was taken to Operating Room, identified as Robin Brady and the procedure verified as Laparoscopic Cholecystectomy with Intraoperative Cholangiogram. A Time Out was held and the above information confirmed.  Prior to the induction of general anesthesia, antibiotic prophylaxis was administered. General endotracheal anesthesia was then administered and tolerated well. After the induction, the abdomen was prepped with Chloraprep and draped in the sterile fashion. The patient was positioned in the supine position.  Local anesthetic agent was injected into the skin near the umbilicus and an incision made. We dissected down to the abdominal fascia with blunt dissection.  The fascia was incised vertically and we entered the peritoneal cavity bluntly.  A pursestring suture of 0-Vicryl was placed around the fascial opening.  The Hasson cannula was inserted and secured with the stay suture.   Pneumoperitoneum was then created with CO2 and tolerated well without any adverse changes in the patient's vital signs. A 5-mm port was placed in the subxiphoid position.  Two 5-mm ports were placed in the right upper quadrant. All skin incisions were infiltrated with a local anesthetic agent before making the incision and placing the trocars.   We positioned the patient in reverse Trendelenburg, tilted slightly to the patient's left.  The gallbladder was identified, the fundus grasped and retracted cephalad. Adhesions were lysed bluntly and with the electrocautery where indicated, taking care not to injure any adjacent organs or viscus. The infundibulum was grasped and retracted laterally, exposing the peritoneum overlying the triangle of Calot. This was then divided and exposed in a blunt fashion. A critical view of the cystic duct and cystic artery was obtained.  The cystic duct was clearly identified and bluntly dissected circumferentially. The cystic duct was ligated with a clip distally.   An incision was made in the cystic duct and the [facility] chol[facility] catheter introduced. The catheter was secured using a clip. A cholangiogram was then obtained which showed good visualization of the distal and proximal biliary tree with no sign of filling defects or obstruction.  Contrast flowed easily into the duodenum. The catheter was then removed.   The cystic duct was then ligated with clips and divided. The cystic artery was identified, dissected free, ligated with clips and divided as well.   The gallbladder was dissected from the liver bed in retrograde fashion with the electrocautery. The gallbladder was removed and placed in an Endocatch sac. The liver bed was irrigated and inspected. Hemostasis was achieved with the electrocautery. Copious irrigation was utilized and was repeatedly aspirated until clear.  The gallbladder and Endocatch sac were then removed through the umbilical port site.  The pursestring  suture was used  to close the umbilical fascia.    We again inspected the right upper quadrant for hemostasis.  Pneumoperitoneum was released as we removed the trocars.  4-0 Monocryl was used to close the skin.   Skin glue was then applied. The patient was then extubated and brought to the recovery room in stable condition. Instrument, sponge, and needle counts were correct at closure and at the conclusion of the case.   Findings: Normal cholangiogram.  Gallbladder grossly normal  Estimated Blood Loss: Minimal         Drains: 0         Specimens: Gallbladder           Complications: None; patient tolerated the procedure well.         Disposition: PACU - hemodynamically stable.         Condition: stable

## 2017-08-28 NOTE — Anesthesia Procedure Notes (Signed)
Procedure Name: Intubation Date/Time: 08/28/2017 11:29 AM Performed by: Audry Pili, MD Pre-anesthesia Checklist: Patient identified, Emergency Drugs available, Suction available, Patient being monitored and Timeout performed Patient Re-evaluated:Patient Re-evaluated prior to induction Oxygen Delivery Method: Circle system utilized Preoxygenation: Pre-oxygenation with 100% oxygen Ventilation: Mask ventilation without difficulty Laryngoscope Size: Mac and 3 Grade View: Grade II Tube type: Oral Tube size: 7.0 mm Number of attempts: 1 Airway Equipment and Method: Stylet Placement Confirmation: ETT inserted through vocal cords under direct vision,  positive ETCO2,  CO2 detector and breath sounds checked- equal and bilateral Secured at: 21 cm Tube secured with: Tape Dental Injury: Teeth and Oropharynx as per pre-operative assessment  Comments: Intubation by Burnett Corrente SRNA

## 2017-08-28 NOTE — Anesthesia Preprocedure Evaluation (Addendum)
Anesthesia Evaluation  Patient identified by MRN, date of birth, ID band Patient awake    Reviewed: Allergy & Precautions, NPO status , Patient's Chart, lab work & pertinent test results, reviewed documented beta blocker date and time   Airway Mallampati: II  TM Distance: >3 FB Neck ROM: Full    Dental  (+) Dental Advisory Given, Teeth Intact   Pulmonary Current Smoker,    Pulmonary exam normal breath sounds clear to auscultation       Cardiovascular hypertension, Pt. on medications and Pt. on home beta blockers Normal cardiovascular exam+ dysrhythmias  Rhythm:Regular Rate:Normal  '15 TTE - Normal LV size with mild LV hypertrophy. EF 60-65% withmoderate diastolic dysfunction. Normal RV size andsystolic function. Aortic sclerosis.   Neuro/Psych negative neurological ROS  negative psych ROS   GI/Hepatic negative GI ROS, Neg liver ROS,   Endo/Other  diabetes, Type 2, Oral Hypoglycemic Agents  Renal/GU negative Renal ROS  negative genitourinary   Musculoskeletal  (+) Arthritis ,   Abdominal   Peds  Hematology  (+) anemia ,   Anesthesia Other Findings   Reproductive/Obstetrics                            Anesthesia Physical Anesthesia Plan  ASA: II  Anesthesia Plan: General   Post-op Pain Management:    Induction: Intravenous  PONV Risk Score and Plan: 4 or greater and Treatment may vary due to age or medical condition, Scopolamine patch - Pre-op, Dexamethasone and Ondansetron  Airway Management Planned: Oral ETT  Additional Equipment: None  Intra-op Plan:   Post-operative Plan: Extubation in OR  Informed Consent: I have reviewed the patients History and Physical, chart, labs and discussed the procedure including the risks, benefits and alternatives for the proposed anesthesia with the patient or authorized representative who has indicated his/her understanding and acceptance.    Dental advisory given  Plan Discussed with: CRNA  Anesthesia Plan Comments:         Anesthesia Quick Evaluation

## 2017-08-28 NOTE — Discharge Instructions (Signed)
[facility] ______[facility] °LAPAROSCOPIC SURGERY: POST OP INSTRUCTIONS °Always review your discharge instruction sheet given to you by the facility where your surgery was performed. °IF YOU HAVE DISABILITY OR FAMILY LEAVE FORMS, YOU MUST BRING THEM TO THE OFFICE FOR PROCESSING.   °DO NOT GIVE THEM TO YOUR DOCTOR. ° °1. A prescription for pain medication may be given to you upon discharge.  Take your pain medication as prescribed, if needed.  If narcotic pain medicine is not needed, then you may take acetaminophen (Tylenol) or ibuprofen (Advil) as needed. °2. Take your usually prescribed medications unless otherwise directed. °3. If you need a refill on your pain medication, please contact your pharmacy.  They will contact our office to request authorization. Prescriptions will not be filled after 5pm or on week-ends. °4. You should follow a light diet the first few days after arrival home, such as soup and crackers, etc.  Be sure to include lots of fluids daily. °5. Most patients will experience some swelling and bruising in the area of the incisions.  Ice packs will help.  Swelling and bruising can take several days to resolve.  °6. It is common to experience some constipation if taking pain medication after surgery.  Increasing fluid intake and taking a stool softener (such as Colace) will usually help or prevent this problem from occurring.  A mild laxative (Milk of Magnesia or Miralax) should be taken according to package instructions if there are no bowel movements after 48 hours. °7. Unless discharge instructions indicate otherwise, you may remove your bandages 24-48 hours after surgery, and you may shower at that time.  You may have steri-strips (small skin tapes) in place directly over the incision.  These strips should be left on the skin for 7-10 days.  If your surgeon used skin glue on the incision, you may shower in 24 hours.  The glue will flake off over the next 2-3 weeks.  Any sutures or  staples will be removed at the office during your follow-up visit. °8. ACTIVITIES:  You may resume regular (light) daily activities beginning the next day--such as daily self-care, walking, climbing stairs--gradually increasing activities as tolerated.  You may have sexual intercourse when it is comfortable.  Refrain from any heavy lifting or straining until approved by your doctor. °a. You may drive when you are no longer taking prescription pain medication, you can comfortably wear a seatbelt, and you can safely maneuver your car and apply brakes. °b. RETURN TO WORK:  __________________________________________________________ °9. You should see your doctor in the office for a follow-up appointment approximately 2-3 weeks after your surgery.  Make sure that you call for this appointment within a day or two after you arrive home to insure a convenient appointment time. °10. OTHER INSTRUCTIONS: __________________________________________________________________________________________________________________________ __________________________________________________________________________________________________________________________ °WHEN TO CALL YOUR DOCTOR: °1. Fever over 101.0 °2. Inability to urinate °3. Continued bleeding from incision. °4. Increased pain, redness, or drainage from the incision. °5. Increasing abdominal pain ° °The clinic staff is available to answer your questions during regular business hours.  Please don’t hesitate to call and ask to speak to one of the nurses for clinical concerns.  If you have a medical emergency, go to the nearest emergency room or call 911.  A surgeon from [facility] is always on call at the hospital. °[address], [address], [place], [place]  [address] ? [address] [address], [place], [place]   [address] °[phone] ? [phone] ? FAX [phone] °Web site:   [email or web address] °

## 2017-08-29 ENCOUNTER — Encounter (HOSPITAL_COMMUNITY): Payer: Self-pay | Admitting: Surgery

## 2017-12-20 ENCOUNTER — Ambulatory Visit: Payer: Medicare Other | Admitting: Cardiovascular Disease

## 2017-12-20 ENCOUNTER — Encounter: Payer: Self-pay | Admitting: Cardiovascular Disease

## 2017-12-20 DIAGNOSIS — E7800 Pure hypercholesterolemia, unspecified: Secondary | ICD-10-CM | POA: Diagnosis not present

## 2017-12-20 DIAGNOSIS — R55 Syncope and collapse: Secondary | ICD-10-CM

## 2017-12-20 DIAGNOSIS — I10 Essential (primary) hypertension: Secondary | ICD-10-CM

## 2017-12-20 NOTE — Assessment & Plan Note (Signed)
History of essential hypertension blood pressure measured today at 110/72.  She is not on antihypertensive medications.

## 2017-12-20 NOTE — Assessment & Plan Note (Signed)
History of hyperlipidemia on statin therapy. 

## 2017-12-20 NOTE — Assessment & Plan Note (Signed)
History of syncope in the past with negative work-up, no recurrence.
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
## 2017-12-20 NOTE — Patient Instructions (Signed)

## 2017-12-24 ENCOUNTER — Telehealth: Payer: Self-pay | Admitting: Cardiovascular Disease

## 2017-12-24 NOTE — Addendum Note (Signed)
Addended by: Kandice RobinsonsYOUNG, TIERICA T on: 12/24/2017 04:05 PM   Modules accepted: Orders

## 2017-12-24 NOTE — Telephone Encounter (Signed)
New Message    Patient is calling in reference to her AVS. She states that it indicates that she is being treated for hypertension and not palpitations. She does not want that to show on her records when that is not her diagnosis.

## 2017-12-25 NOTE — Telephone Encounter (Signed)
LMTCB

## 2017-12-30 NOTE — Telephone Encounter (Signed)
Spoke with pt, she has never had hypertension, she takes metoprolol for palpitations. Hypertension was resolved on her problem list.

## 2018-07-09 DIAGNOSIS — R944 Abnormal results of kidney function studies: Secondary | ICD-10-CM | POA: Insufficient documentation

## 2018-11-19 ENCOUNTER — Telehealth: Payer: Self-pay | Admitting: *Deleted

## 2018-11-19 NOTE — Telephone Encounter (Signed)
A message was left, re: follow up visit. 

## 2018-12-15 ENCOUNTER — Other Ambulatory Visit: Payer: Self-pay

## 2018-12-15 ENCOUNTER — Ambulatory Visit: Payer: Medicare Other | Admitting: Podiatry

## 2018-12-15 ENCOUNTER — Encounter: Payer: Self-pay | Admitting: Podiatry

## 2018-12-15 VITALS — Temp 97.8°F

## 2018-12-15 DIAGNOSIS — L600 Ingrowing nail: Secondary | ICD-10-CM | POA: Diagnosis not present

## 2018-12-15 MED ORDER — NEOMYCIN-POLYMYXIN-HC 3.5-10000-1 OT SOLN
OTIC | 1 refills | Status: DC
Start: 2018-12-15 — End: 2020-10-10

## 2018-12-15 NOTE — Patient Instructions (Signed)

## 2018-12-15 NOTE — Progress Notes (Signed)
Subjective:   Patient ID: Robin Brady, female   DOB: 73 y.o.   MRN: [id number]   HPI Patient presents stating she has had a painful ingrown toenail that is been going on for years and is increasingly hard for her to trim out herself.  Patient states she is tried trimming soaking without relief and patient does smoke quarter pack per day and is active   Review of Systems  All other systems reviewed and are negative.       Objective:  Physical Exam Vitals signs and nursing note reviewed.  Constitutional:      Appearance: She is well-developed.  Pulmonary:     Effort: Pulmonary effort is normal.  Musculoskeletal: Normal range of motion.  Skin:    General: Skin is warm.  Neurological:     Mental Status: She is alert.     Neurovascular status found to be intact muscle strength adequate range of motion within normal limits with patient noted to have incurvated medial border of the left hallux that is painful when pressed and is made it hard for her to wear shoe gear.  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  I explained procedure risk and patient signed consent form after review and today I infiltrated the left hallux 60 mg like Marcaine mixture sterile instrumentation allowed removal of the border exposed matrix and applied phenol 3 applications 30 seconds followed by alcohol lavage sterile dressing.  Gave instructions on soaks and reappoint to recheck     Plan:  Reviewed above findings and also wrote prescription for drops and encouraged to call with questions concerns

## 2019-01-14 ENCOUNTER — Telehealth: Payer: Medicare Other | Admitting: Cardiovascular Disease

## 2019-05-12 ENCOUNTER — Ambulatory Visit: Payer: Medicare Other | Admitting: Cardiovascular Disease

## 2019-05-12 ENCOUNTER — Other Ambulatory Visit: Payer: Self-pay

## 2019-05-12 ENCOUNTER — Encounter: Payer: Self-pay | Admitting: Cardiovascular Disease

## 2019-05-12 VITALS — BP 118/60 | HR 59 | Temp 97.2°F | Ht 61.0 in | Wt 149.0 lb

## 2019-05-12 DIAGNOSIS — I10 Essential (primary) hypertension: Secondary | ICD-10-CM

## 2019-05-12 DIAGNOSIS — E782 Mixed hyperlipidemia: Secondary | ICD-10-CM | POA: Diagnosis not present

## 2019-05-12 DIAGNOSIS — R55 Syncope and collapse: Secondary | ICD-10-CM

## 2019-05-12 NOTE — Patient Instructions (Addendum)
Medication Instructions:  Your physician recommends that you continue on your current medications as directed. Please refer to the Current Medication list given to you today.  If you need a refill on your cardiac medications before your next appointment, please call your pharmacy.   Lab work: NONE  Testing/Procedures: NONE  Follow-Up: At [facility], you and your health needs are our priority.  As part of our continuing mission to provide you with exceptional heart care, we have created designated Provider Care Teams.  These Care Teams include your primary Cardiologist (physician) and Advanced Practice Providers (APPs -  Physician Assistants and Nurse Practitioners) who all work together to provide you with the care you need, when you need it. You may see Dr Berry or one of the following Advanced Practice Providers on your designated Care Team:    Luke Kilroy, PA-C  Callie Goodrich, PA-C  Jesse Cleaver, FNP  Your physician wants you to follow-up in: 1 year. You will receive a reminder letter in the mail two months in advance. If you don't receive a letter, please call our office to schedule the follow-up appointment.      

## 2019-05-12 NOTE — Assessment & Plan Note (Signed)
History of hyperlipidemia on Crestor 4 days a week with lipid profile performed a year ago revealing total cholesterol of 220, LDL of 110 HDL 61.
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
## 2019-05-12 NOTE — Assessment & Plan Note (Signed)
History of an episode of syncope in the past with negative work-up and no recurrent symptoms.

## 2019-08-22 ENCOUNTER — Ambulatory Visit: Payer: Medicare Other | Attending: Internal Medicine

## 2019-08-22 DIAGNOSIS — Z23 Encounter for immunization: Secondary | ICD-10-CM | POA: Insufficient documentation

## 2019-08-22 NOTE — Progress Notes (Signed)
   Covid-19 Vaccination Clinic  Name:  Robin Brady    MRN: [id number] DOB: [birth year]  08/22/2019  Robin Brady was observed post Covid-19 immunization for 15 minutes without incidence. She was provided with Vaccine Information Sheet and instruction to access the V-Safe system.   Robin Brady was instructed to call 911 with any severe reactions post vaccine: Marland Kitchen Difficulty breathing  . Swelling of your face and throat  . A fast heartbeat  . A bad rash all over your body  . Dizziness and weakness    Immunizations Administered    Name Date Dose VIS Date Route   Pfizer COVID-19 Vaccine 08/22/2019 12:30 PM 0.3 mL 06/12/2019 Intramuscular   Manufacturer: [employer], [employer]   Lot: [id number]   NDC: [address]-9

## 2019-09-15 ENCOUNTER — Ambulatory Visit: Payer: Medicare Other | Attending: Internal Medicine

## 2019-09-15 DIAGNOSIS — Z23 Encounter for immunization: Secondary | ICD-10-CM

## 2019-09-15 NOTE — Progress Notes (Signed)
   Covid-19 Vaccination Clinic  Name:  Robin Brady    MRN: [id number] DOB: 01-06-1946  09/15/2019  [name] was observed post Covid-19 immunization for 15 minutes without incident. She was provided with Vaccine Information Sheet and instruction to access the V-Safe system.   [name] was instructed to call 911 with any severe reactions post vaccine: Marland Kitchen Difficulty breathing  . Swelling of face and throat  . A fast heartbeat  . A bad rash all over body  . Dizziness and weakness   Immunizations Administered    Name Date Dose VIS Date Route   Pfizer COVID-19 Vaccine 09/15/2019 12:30 PM 0.3 mL 06/12/2019 Intramuscular   Manufacturer: [employer], [employer]   Lot: [id number]   NDC: [address]-9

## 2020-08-12 ENCOUNTER — Ambulatory Visit: Payer: Medicare Other | Admitting: Cardiovascular Disease

## 2020-08-12 ENCOUNTER — Other Ambulatory Visit: Payer: Self-pay

## 2020-08-12 ENCOUNTER — Encounter: Payer: Self-pay | Admitting: Cardiovascular Disease

## 2020-08-12 DIAGNOSIS — R002 Palpitations: Secondary | ICD-10-CM

## 2020-08-12 DIAGNOSIS — R55 Syncope and collapse: Secondary | ICD-10-CM

## 2020-08-12 DIAGNOSIS — E782 Mixed hyperlipidemia: Secondary | ICD-10-CM

## 2020-08-12 NOTE — Assessment & Plan Note (Signed)
History of palpitations in the past but none recently.
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
## 2020-08-12 NOTE — Patient Instructions (Signed)
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
## 2020-08-12 NOTE — Assessment & Plan Note (Signed)
History of hyperlipidemia on statin therapy with lipid profile performed by her PCP 02/11/2020 revealing a total cholesterol of 192, LDL 109 and HDL 59.

## 2020-10-09 ENCOUNTER — Encounter (HOSPITAL_BASED_OUTPATIENT_CLINIC_OR_DEPARTMENT_OTHER): Payer: Self-pay | Admitting: *Deleted

## 2020-10-09 ENCOUNTER — Inpatient Hospital Stay (HOSPITAL_BASED_OUTPATIENT_CLINIC_OR_DEPARTMENT_OTHER)
Admission: EM | Admit: 2020-10-09 | Discharge: 2020-10-13 | DRG: 439 | Disposition: A | Discharge status: Home / Self Care | Payer: Medicare Other | Attending: Student | Admitting: Student

## 2020-10-09 ENCOUNTER — Emergency Department (HOSPITAL_BASED_OUTPATIENT_CLINIC_OR_DEPARTMENT_OTHER): Payer: Medicare Other

## 2020-10-09 ENCOUNTER — Other Ambulatory Visit: Payer: Self-pay

## 2020-10-09 DIAGNOSIS — Z7982 Long term (current) use of aspirin: Secondary | ICD-10-CM | POA: Diagnosis not present

## 2020-10-09 DIAGNOSIS — K5900 Constipation, unspecified: Secondary | ICD-10-CM | POA: Diagnosis present

## 2020-10-09 DIAGNOSIS — R7989 Other specified abnormal findings of blood chemistry: Secondary | ICD-10-CM | POA: Diagnosis not present

## 2020-10-09 DIAGNOSIS — E663 Overweight: Secondary | ICD-10-CM | POA: Diagnosis present

## 2020-10-09 DIAGNOSIS — Z91040 Latex allergy status: Secondary | ICD-10-CM | POA: Diagnosis not present

## 2020-10-09 DIAGNOSIS — Z7984 Long term (current) use of oral hypoglycemic drugs: Secondary | ICD-10-CM | POA: Diagnosis not present

## 2020-10-09 DIAGNOSIS — Z888 Allergy status to other drugs, medicaments and biological substances status: Secondary | ICD-10-CM | POA: Diagnosis not present

## 2020-10-09 DIAGNOSIS — I10 Essential (primary) hypertension: Secondary | ICD-10-CM

## 2020-10-09 DIAGNOSIS — E118 Type 2 diabetes mellitus with unspecified complications: Secondary | ICD-10-CM | POA: Diagnosis not present

## 2020-10-09 DIAGNOSIS — Z811 Family history of alcohol abuse and dependence: Secondary | ICD-10-CM | POA: Diagnosis not present

## 2020-10-09 DIAGNOSIS — N179 Acute kidney failure, unspecified: Secondary | ICD-10-CM | POA: Diagnosis present

## 2020-10-09 DIAGNOSIS — Z807 Family history of other malignant neoplasms of lymphoid, hematopoietic and related tissues: Secondary | ICD-10-CM | POA: Diagnosis not present

## 2020-10-09 DIAGNOSIS — D72825 Bandemia: Secondary | ICD-10-CM | POA: Diagnosis not present

## 2020-10-09 DIAGNOSIS — K859 Acute pancreatitis without necrosis or infection, unspecified: Principal | ICD-10-CM | POA: Diagnosis present

## 2020-10-09 DIAGNOSIS — E041 Nontoxic single thyroid nodule: Secondary | ICD-10-CM | POA: Diagnosis present

## 2020-10-09 DIAGNOSIS — F17210 Nicotine dependence, cigarettes, uncomplicated: Secondary | ICD-10-CM | POA: Diagnosis present

## 2020-10-09 DIAGNOSIS — Z6826 Body mass index (BMI) 26.0-26.9, adult: Secondary | ICD-10-CM | POA: Diagnosis not present

## 2020-10-09 DIAGNOSIS — Z882 Allergy status to sulfonamides status: Secondary | ICD-10-CM

## 2020-10-09 DIAGNOSIS — E785 Hyperlipidemia, unspecified: Secondary | ICD-10-CM | POA: Diagnosis present

## 2020-10-09 DIAGNOSIS — Z833 Family history of diabetes mellitus: Secondary | ICD-10-CM | POA: Diagnosis not present

## 2020-10-09 DIAGNOSIS — Z20822 Contact with and (suspected) exposure to covid-19: Secondary | ICD-10-CM | POA: Diagnosis present

## 2020-10-09 DIAGNOSIS — Z79899 Other long term (current) drug therapy: Secondary | ICD-10-CM | POA: Diagnosis not present

## 2020-10-09 DIAGNOSIS — Z8249 Family history of ischemic heart disease and other diseases of the circulatory system: Secondary | ICD-10-CM | POA: Diagnosis not present

## 2020-10-09 DIAGNOSIS — E782 Mixed hyperlipidemia: Secondary | ICD-10-CM | POA: Diagnosis not present

## 2020-10-09 DIAGNOSIS — Z9049 Acquired absence of other specified parts of digestive tract: Secondary | ICD-10-CM | POA: Diagnosis not present

## 2020-10-09 DIAGNOSIS — M1990 Unspecified osteoarthritis, unspecified site: Secondary | ICD-10-CM | POA: Diagnosis present

## 2020-10-09 DIAGNOSIS — D649 Anemia, unspecified: Secondary | ICD-10-CM | POA: Diagnosis not present

## 2020-10-09 DIAGNOSIS — Z880 Allergy status to penicillin: Secondary | ICD-10-CM

## 2020-10-09 DIAGNOSIS — K8500 Idiopathic acute pancreatitis without necrosis or infection: Secondary | ICD-10-CM | POA: Diagnosis not present

## 2020-10-09 DIAGNOSIS — E1169 Type 2 diabetes mellitus with other specified complication: Secondary | ICD-10-CM | POA: Diagnosis present

## 2020-10-09 DIAGNOSIS — N1831 Chronic kidney disease, stage 3a: Secondary | ICD-10-CM | POA: Diagnosis not present

## 2020-10-09 LAB — CBC
HCT: 40 % (ref 36.0–46.0)
Hemoglobin: 13.3 g/dL (ref 12.0–15.0)
MCH: 32.8 pg (ref 26.0–34.0)
MCHC: 33.3 g/dL (ref 30.0–36.0)
MCV: 98.5 fL (ref 80.0–100.0)
Platelets: 254 10*3/uL (ref 150–400)
RBC: 4.06 MIL/uL (ref 3.87–5.11)
RDW: 12.9 % (ref 11.5–15.5)
WBC: 13 10*3/uL — ABNORMAL HIGH (ref 4.0–10.5)
nRBC: 0 % (ref 0.0–0.2)

## 2020-10-09 LAB — COMPREHENSIVE METABOLIC PANEL
ALT: 12 U/L (ref 0–44)
AST: 20 U/L (ref 15–41)
Albumin: 4.4 g/dL (ref 3.5–5.0)
Alkaline Phosphatase: 46 U/L (ref 38–126)
Anion gap: 10 (ref 5–15)
BUN: 33 mg/dL — ABNORMAL HIGH (ref 8–23)
CO2: 27 mmol/L (ref 22–32)
Calcium: 10.2 mg/dL (ref 8.9–10.3)
Chloride: 104 mmol/L (ref 98–111)
Creatinine, Ser: 1.35 mg/dL — ABNORMAL HIGH (ref 0.44–1.00)
GFR, Estimated: 41 mL/min — ABNORMAL LOW (ref >60)
Glucose, Bld: 120 mg/dL — ABNORMAL HIGH (ref 70–99)
Potassium: 4 mmol/L (ref 3.5–5.1)
Sodium: 141 mmol/L (ref 135–145)
Total Bilirubin: 0.3 mg/dL (ref 0.3–1.2)
Total Protein: 7.2 g/dL (ref 6.5–8.1)

## 2020-10-09 LAB — URINALYSIS, ROUTINE W REFLEX MICROSCOPIC
Bilirubin Urine: NEGATIVE
Glucose, UA: NEGATIVE mg/dL
Hgb urine dipstick: NEGATIVE
Ketones, ur: NEGATIVE mg/dL
Nitrite: NEGATIVE
Protein, ur: NEGATIVE mg/dL
Specific Gravity, Urine: 1.017 (ref 1.005–1.030)
pH: 7.5 (ref 5.0–8.0)

## 2020-10-09 LAB — RESP PANEL BY RT-PCR (FLU A&B, COVID) ARPGX2
Influenza A by PCR: NEGATIVE
Influenza B by PCR: NEGATIVE
SARS Coronavirus 2 by RT PCR: NEGATIVE

## 2020-10-09 LAB — LIPASE, BLOOD: Lipase: 3862 U/L — ABNORMAL HIGH (ref 11–51)

## 2020-10-09 MED ORDER — SODIUM CHLORIDE 0.9 % IV BOLUS (SEPSIS)
1000.0000 mL | Freq: Once | INTRAVENOUS | Status: AC
  Administered 2020-10-09: 1000 mL via INTRAVENOUS

## 2020-10-09 MED ORDER — IOHEXOL 300 MG/ML  SOLN
75.0000 mL | Freq: Once | INTRAMUSCULAR | Status: AC | PRN
  Administered 2020-10-09: 75 mL via INTRAVENOUS

## 2020-10-09 MED ORDER — FENTANYL CITRATE (PF) 100 MCG/2ML IJ SOLN
50.0000 ug | Freq: Once | INTRAMUSCULAR | Status: AC
  Administered 2020-10-09: 50 ug via INTRAVENOUS
  Filled 2020-10-09: qty 2

## 2020-10-09 MED ORDER — FENTANYL CITRATE (PF) 100 MCG/2ML IJ SOLN: 50.0000 ug | Freq: Once | INTRAMUSCULAR | Status: AC

## 2020-10-09 MED ORDER — SODIUM CHLORIDE 0.9 % IV SOLN
1000.0000 mL | INTRAVENOUS | Status: DC
  Administered 2020-10-09 – 2020-10-11 (×6): 1000 mL via INTRAVENOUS

## 2020-10-09 MED ORDER — ONDANSETRON HCL 4 MG/2ML IJ SOLN
4.0000 mg | Freq: Once | INTRAMUSCULAR | Status: AC
  Administered 2020-10-09: 4 mg via INTRAVENOUS
  Filled 2020-10-09: qty 2

## 2020-10-09 MED ORDER — FENTANYL CITRATE (PF) 100 MCG/2ML IJ SOLN
INTRAMUSCULAR | Status: AC
  Administered 2020-10-09: 50 ug via INTRAVENOUS
  Filled 2020-10-09: qty 2

## 2020-10-09 NOTE — ED Triage Notes (Signed)
Upper abdominal pain that started today. Vomiting x 3. Hx of pancreatitis-reports that it feels the same.
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
## 2020-10-09 NOTE — ED Notes (Signed)
[facility] at the Bedside. 

## 2020-10-10 ENCOUNTER — Encounter (HOSPITAL_COMMUNITY): Payer: Self-pay | Admitting: Family Medicine

## 2020-10-10 DIAGNOSIS — K8500 Idiopathic acute pancreatitis without necrosis or infection: Secondary | ICD-10-CM | POA: Diagnosis not present

## 2020-10-10 DIAGNOSIS — I10 Essential (primary) hypertension: Secondary | ICD-10-CM

## 2020-10-10 DIAGNOSIS — E1169 Type 2 diabetes mellitus with other specified complication: Secondary | ICD-10-CM | POA: Diagnosis present

## 2020-10-10 DIAGNOSIS — F17210 Nicotine dependence, cigarettes, uncomplicated: Secondary | ICD-10-CM | POA: Diagnosis not present

## 2020-10-10 DIAGNOSIS — E118 Type 2 diabetes mellitus with unspecified complications: Secondary | ICD-10-CM

## 2020-10-10 DIAGNOSIS — E782 Mixed hyperlipidemia: Secondary | ICD-10-CM

## 2020-10-10 LAB — CBC WITH DIFFERENTIAL/PLATELET
Abs Immature Granulocytes: 0.04 10*3/uL (ref 0.00–0.07)
Basophils Absolute: 0.1 10*3/uL (ref 0.0–0.1)
Basophils Relative: 0 %
Eosinophils Absolute: 0.2 10*3/uL (ref 0.0–0.5)
Eosinophils Relative: 2 %
HCT: 34.7 % — ABNORMAL LOW (ref 36.0–46.0)
Hemoglobin: 11.4 g/dL — ABNORMAL LOW (ref 12.0–15.0)
Immature Granulocytes: 0 %
Lymphocytes Relative: 24 %
Lymphs Abs: 2.6 10*3/uL (ref 0.7–4.0)
MCH: 34 pg (ref 26.0–34.0)
MCHC: 32.9 g/dL (ref 30.0–36.0)
MCV: 103.6 fL — ABNORMAL HIGH (ref 80.0–100.0)
Monocytes Absolute: 0.8 10*3/uL (ref 0.1–1.0)
Monocytes Relative: 7 %
Neutro Abs: 7.4 10*3/uL (ref 1.7–7.7)
Neutrophils Relative %: 67 %
Platelets: 194 10*3/uL (ref 150–400)
RBC: 3.35 MIL/uL — ABNORMAL LOW (ref 3.87–5.11)
RDW: 13.2 % (ref 11.5–15.5)
WBC: 11.1 10*3/uL — ABNORMAL HIGH (ref 4.0–10.5)
nRBC: 0 % (ref 0.0–0.2)

## 2020-10-10 LAB — COMPREHENSIVE METABOLIC PANEL
ALT: 11 U/L (ref 0–44)
AST: 23 U/L (ref 15–41)
Albumin: 3.2 g/dL — ABNORMAL LOW (ref 3.5–5.0)
Alkaline Phosphatase: 41 U/L (ref 38–126)
Anion gap: 7 (ref 5–15)
BUN: 24 mg/dL — ABNORMAL HIGH (ref 8–23)
CO2: 22 mmol/L (ref 22–32)
Calcium: 8.7 mg/dL — ABNORMAL LOW (ref 8.9–10.3)
Chloride: 112 mmol/L — ABNORMAL HIGH (ref 98–111)
Creatinine, Ser: 1.12 mg/dL — ABNORMAL HIGH (ref 0.44–1.00)
GFR, Estimated: 51 mL/min — ABNORMAL LOW (ref >60)
Glucose, Bld: 98 mg/dL (ref 70–99)
Potassium: 4 mmol/L (ref 3.5–5.1)
Sodium: 141 mmol/L (ref 135–145)
Total Bilirubin: 0.7 mg/dL (ref 0.3–1.2)
Total Protein: 5.8 g/dL — ABNORMAL LOW (ref 6.5–8.1)

## 2020-10-10 LAB — RAPID URINE DRUG SCREEN, HOSP PERFORMED
Amphetamines: NOT DETECTED
Barbiturates: NOT DETECTED
Benzodiazepines: NOT DETECTED
Cocaine: NOT DETECTED
Opiates: NOT DETECTED
Tetrahydrocannabinol: NOT DETECTED

## 2020-10-10 LAB — MAGNESIUM: Magnesium: 1.9 mg/dL (ref 1.7–2.4)

## 2020-10-10 LAB — GLUCOSE, CAPILLARY
Glucose-Capillary: 111 mg/dL — ABNORMAL HIGH (ref 70–99)
Glucose-Capillary: 96 mg/dL (ref 70–99)
Glucose-Capillary: 93 mg/dL (ref 70–99)
Glucose-Capillary: 79 mg/dL (ref 70–99)
Glucose-Capillary: 75 mg/dL (ref 70–99)

## 2020-10-10 LAB — LIPID PANEL
Cholesterol: 149 mg/dL (ref 0–200)
HDL: 48 mg/dL (ref >40)
LDL Cholesterol: 78 mg/dL (ref 0–99)
Total CHOL/HDL Ratio: 3.1 RATIO
Triglycerides: 117 mg/dL (ref <150)
VLDL: 23 mg/dL (ref 0–40)

## 2020-10-10 LAB — LIPASE, BLOOD: Lipase: 1256 U/L — ABNORMAL HIGH (ref 11–51)

## 2020-10-10 LAB — HEMOGLOBIN A1C
Hgb A1c MFr Bld: 6.4 % — ABNORMAL HIGH (ref 4.8–5.6)
Mean Plasma Glucose: 136.98 mg/dL

## 2020-10-10 MED ORDER — ONDANSETRON HCL 4 MG/2ML IJ SOLN: 4.0000 mg | Freq: Four times a day (QID) | INTRAMUSCULAR | Status: DC | PRN

## 2020-10-10 MED ORDER — POLYETHYLENE GLYCOL 3350 17 G PO PACK
17.0000 g | PACK | Freq: Every day | ORAL | Status: DC | PRN
  Administered 2020-10-11: 17 g via ORAL
  Filled 2020-10-10: qty 1

## 2020-10-10 MED ORDER — MORPHINE SULFATE (PF) 2 MG/ML IV SOLN: 2.0000 mg | INTRAVENOUS | Status: DC | PRN

## 2020-10-10 MED ORDER — ROSUVASTATIN CALCIUM 5 MG PO TABS
5.0000 mg | ORAL_TABLET | ORAL | Status: DC
  Administered 2020-10-10 – 2020-10-12 (×2): 5 mg via ORAL
  Filled 2020-10-10 (×2): qty 1

## 2020-10-10 MED ORDER — ENOXAPARIN SODIUM 40 MG/0.4ML ~~LOC~~ SOLN
40.0000 mg | SUBCUTANEOUS | Status: DC
  Administered 2020-10-10 – 2020-10-13 (×4): 40 mg via SUBCUTANEOUS
  Filled 2020-10-10 (×4): qty 0.4

## 2020-10-10 MED ORDER — METOPROLOL SUCCINATE ER 25 MG PO TB24
25.0000 mg | ORAL_TABLET | Freq: Every day | ORAL | Status: DC
  Administered 2020-10-10 – 2020-10-13 (×4): 25 mg via ORAL
  Filled 2020-10-10 (×4): qty 1

## 2020-10-10 MED ORDER — ACETAMINOPHEN 650 MG RE SUPP: 650.0000 mg | Freq: Four times a day (QID) | RECTAL | Status: DC | PRN

## 2020-10-10 MED ORDER — INSULIN ASPART 100 UNIT/ML ~~LOC~~ SOLN
0.0000 [IU] | Freq: Four times a day (QID) | SUBCUTANEOUS | Status: DC
  Administered 2020-10-12: 2 [IU] via SUBCUTANEOUS
  Administered 2020-10-12: 5 [IU] via SUBCUTANEOUS
  Administered 2020-10-13: 2 [IU] via SUBCUTANEOUS

## 2020-10-10 MED ORDER — MORPHINE SULFATE (PF) 4 MG/ML IV SOLN: 4.0000 mg | INTRAVENOUS | Status: DC | PRN

## 2020-10-10 MED ORDER — ONDANSETRON HCL 4 MG PO TABS: 4.0000 mg | ORAL_TABLET | Freq: Four times a day (QID) | ORAL | Status: DC | PRN

## 2020-10-10 MED ORDER — ACETAMINOPHEN 325 MG PO TABS: 650.0000 mg | ORAL_TABLET | Freq: Four times a day (QID) | ORAL | Status: DC | PRN

## 2020-10-10 MED ORDER — PREDNISOLONE ACETATE 1 % OP SUSP
1.0000 [drp] | Freq: Every day | OPHTHALMIC | Status: DC
  Administered 2020-10-13: 1 [drp] via OPHTHALMIC
  Filled 2020-10-10: qty 5

## 2020-10-10 MED ORDER — ASPIRIN EC 81 MG PO TBEC
81.0000 mg | DELAYED_RELEASE_TABLET | ORAL | Status: DC
  Administered 2020-10-12: 81 mg via ORAL
  Filled 2020-10-10: qty 1

## 2020-10-10 NOTE — ED Notes (Signed)
50 mcg fentanyl wasted with Marketing executive at [employer]. Unable to waste in pyxis due to being pulled from [facility] system.

## 2020-10-10 NOTE — H&P (Signed)
History and Physical    Robin Brady:[id number] DOB: 16-Jun-1946 DOA: 10/09/2020  PCP: Aletha Halim., PA-C  Patient coming from: [facility] ED   Chief Complaint:  Chief Complaint  Patient presents with  . Abdominal Pain     HPI:    75 year old female with past medical history of diabetes mellitus type 2, hypertension, hyperlipidemia and nicotine dependence who presents to the [facility] emergency department with complaints of abdominal pain nausea and vomiting.  Patient explains that the afternoon of 11/10 she was having lunch with her husband at [facility] and she suddenly began to experience intense nausea.  His nausea was associated with severe upper quadrant and epigastric pain.  Patient describes the pain is severe in intensity, sharp in quality and radiating towards her back.  Pain and nausea rapidly became more more severe.  She and her husband proceeded to go home by the time the patient got home she was already vomiting.  Patient describes the vomitus as nonbilious and nonbloody.  Upon further questioning patient denies dysuria, fevers, sick contacts, recent ingestion of undercooked food, recent travel or recent contact with confirmed COVID-19 infection.  Patient denies regular alcohol use, patient denies being placed on any new prescription medications.  Due to patient's progressively worsening symptoms throughout the afternoon and evening the patient actually presented to [facility] emergency department where upon being evaluated patient was found to have a substantially elevated lipase of 3862.  CT imaging of the abdomen pelvis was performed revealing moderate peripancreatic inflammation consistent with acute pancreatitis without any evidence of infection necrosis or fluid collection.  Patient was initiated on intravenous fluids and intravenous opiate-based analgesics.  The hospitalist group was then called to assess the patient for mission to the hospital.   Review of  Systems:   Review of Systems  Constitutional: Positive for malaise/fatigue.  Gastrointestinal: Positive for abdominal pain, nausea and vomiting.    Past Medical History:  Diagnosis Date  . Anemia   . Arthritis    OA  . Constipation   . Diabetes ([place])   . Diabetes mellitus without complication ([place])   . Dysrhythmia    HX PALPITATIONS   . Hyperlipemia   . Hypertension   . Loss of consciousness ([place])   . Pancreatitis   . Ptosis   . Thyroid nodule    8-10 YRS  NO CHANGE    Past Surgical History:  Procedure Laterality Date  . BREAST CYSTECTOMY Bilateral 1660   w/ Silicone implants  . BREAST IMPLANT REMOVAL Bilateral 1989   Replaced w/ saline implants  . CARDIOVASCULAR STRESS TEST  09/30/2008   No evidence of signifcant ischemia.   Marland Kitchen CAROTID ANGIOGRAM  06/03/2007   No evidence to suggest occlusion, stenosis, dissection, aneurysm, or dural AV fistulas on images provided. ophthalmic artierieshave normal originsw/ normal capillary blush.  Marland Kitchen CATARACT EXTRACTION Bilateral 2006  . CHOLECYSTECTOMY N/A 08/28/2017   Procedure: LAPAROSCOPIC CHOLECYSTECTOMY WITH INTRAOPERATIVE CHOLANGIOGRAM;  Surgeon: Coralie Keens, MD;  Location: [facility];  Service: General;  Laterality: N/A;  . CORNEAL TRANSPLANT Right 1960, 1986, 1991, & 2004  . PARS PLANA VITRECTOMY W/ REPAIR OF MACULAR HOLE  2009  . PILONIDAL CYST EXCISION  1964  . TONSILLECTOMY  1948     reports that she has been smoking. She has been smoking about 0.25 packs per day. She has never used smokeless tobacco. She reports that she does not drink alcohol and does not use drugs.  Allergies  Allergen Reactions  . Penicillins  Hives and Other (See Comments)    As a child. Tolerates cephalosporins. Has patient had a PCN reaction causing immediate rash, facial/tongue/throat swelling, SOB or lightheadedness with hypotension: No Has patient had a PCN reaction causing severe rash involving mucus membranes or skin necrosis: No Has patient had  a PCN reaction that required hospitalization No Has patient had a PCN reaction occurring within the last 10 years: No If all of the above answers are "NO", then may proceed with Cephalosporin use.  . Statins Other (See Comments)    Muscle cramps  . Sulfa Antibiotics Swelling and Other (See Comments)    angioedema  . Tussionex Pennkinetic Er [Hydrocod Polst-Cpm Polst Er] Other (See Comments)    "thought she was going to die", Cold sweat, Bradycardia, Tolerates hydrocodone  . Latex Swelling, Rash and Other (See Comments)    Local swelling    Family History  Problem Relation Age of Onset  . Multiple myeloma Mother   . Heart failure Father   . Diabetes Father   . Heart attack Father 33  . Diabetes Brother 25  . Hypertension Brother 71  . Diabetes Paternal Grandmother   . Crohn's disease Brother 21  . Diabetes Maternal Grandfather   . Diabetes Brother 56  . Alcoholism Brother   . 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     Prior to Admission medications   Medication Sig Start Date End Date Taking? Authorizing Provider  aspirin EC 81 MG tablet Take 81 mg by mouth every Monday, Wednesday, and Friday.   Yes [provider]  Calcium Carbonate-Vitamin D 600-400 MG-UNIT tablet Take 1 tablet by mouth daily.   Yes [provider]  Cholecalciferol (VITAMIN D) 2000 UNITS CAPS Take 2,000 Units by mouth daily.    Yes [provider]  glucose blood (PRECISION QID TEST) test strip Test blood sugars twice a day and as needed 10/23/17  Yes [provider]  metFORMIN (GLUCOPHAGE-XR) 500 MG 24 hr tablet Take 500 mg by mouth daily with breakfast.   Yes [provider]  metoprolol succinate (TOPROL-XL) 25 MG 24 hr tablet Take 25 mg by mouth daily.   Yes [provider]  metroNIDAZOLE (METROGEL) 0.75 % gel Apply 1 application topically daily. 04/20/16  Yes [provider]  mometasone  (ELOCON) 0.1 % cream Apply 1 application topically daily as needed (for rash).    Yes [provider]  Multiple Vitamins-Minerals (MULTIVITAMIN PO) Take 1 tablet by mouth daily.   Yes [provider]  Omega 3 1000 MG CAPS Take 3,000 mg by mouth 2 (two) times daily with a meal.    Yes [provider]  polyethylene glycol (MIRALAX / GLYCOLAX) packet Take 17 g by mouth daily.   Yes [provider]  prednisoLONE acetate (PRED FORTE) 1 % ophthalmic suspension Place 1 drop into both eyes daily.   Yes [provider]  rosuvastatin (CRESTOR) 5 MG tablet Take 5 mg by mouth every Monday, Wednesday, Friday, and Saturday at 6 PM. M/w/f/s   Yes [provider]  vitamin B-12 (CYANOCOBALAMIN) 1000 MCG tablet Take 1,000 mcg by mouth daily. M/w/f/s   Yes [provider]    Physical Exam: Vitals:   10/09/20 2015 10/09/20 2345 10/10/20 0122 10/10/20 0544  BP: (!) 149/85 (!) 156/67 132/63 115/60  Pulse: (!) 55 (!) 55 (!) 50 (!) 51  Resp: _0 Temp:  Marland Kitchen)  97.5 F (36.4 C) 98 F (36.7 C) 97.9 F (36.6 C)  TempSrc:  Oral    SpO2: 99% 98% 97% 96%  Weight:      Height:        Constitutional: Awake alert and oriented x3, patient is in mild distress due to pain.   Skin: no rashes, no lesions, poor skin turgor noted. Eyes: Pupils are equally reactive to light.  No evidence of scleral icterus or conjunctival pallor.  ENMT: Dry mucous membranes noted.  Posterior pharynx clear of any exudate or lesions.   Neck: normal, supple, no masses, no thyromegaly.  No evidence of jugular venous distension.   Respiratory: clear to auscultation bilaterally, no wheezing, no crackles. Normal respiratory effort. No accessory muscle use.  Cardiovascular: Regular rate and rhythm, no murmurs / rubs / gallops. No extremity edema. 2+ pedal pulses. No carotid bruits.  Chest:   Nontender without crepitus or deformity.   Back:   Nontender without crepitus or  deformity. Abdomen: Minimal tenderness in the epigastric and right upper quadrant regions along with some tenderness in the lower abdomen.  Abdomen is soft however.  No masses noted.  Bowel sounds noted in all quadrants.  Musculoskeletal: No joint deformity upper and lower extremities. Good ROM, no contractures. Normal muscle tone.  Neurologic: CN 2-12 grossly intact. Sensation intact.  Patient moving all 4 extremities spontaneously.  Patient is following all commands.  Patient is responsive to verbal stimuli.   Psychiatric: Patient exhibits normal mood with appropriate affect.  Patient seems to possess insight as to their current situation.     Labs on Admission: I have personally reviewed following labs and imaging studies -   CBC: Recent Labs  Lab 10/09/20 1919 10/10/20 0421  WBC 13.0* 11.1*  NEUTROABS  --  7.4  HGB 13.3 11.4*  HCT 40.0 34.7*  MCV 98.5 103.6*  PLT 254 902   Basic Metabolic Panel: Recent Labs  Lab 10/09/20 1919 10/10/20 0421  NA 141 141  K 4.0 4.0  CL 104 112*  CO2 27 22  GLUCOSE 120* 98  BUN 33* 24*  CREATININE 1.35* 1.12*  CALCIUM 10.2 8.7*  MG  --  1.9   GFR: Estimated Creatinine Clearance: 41.4 mL/min (A) (by C-G formula based on SCr of 1.12 mg/dL (H)). Liver Function Tests: Recent Labs  Lab 10/09/20 1919 10/10/20 0421  AST 20 23  ALT 12 11  ALKPHOS 46 41  BILITOT 0.3 0.7  PROT 7.2 5.8*  ALBUMIN 4.4 3.2*   Recent Labs  Lab 10/09/20 1919 10/10/20 0421  LIPASE 3,862* 1,256*   No results for input(s): AMMONIA in the last 168 hours. Coagulation Profile: No results for input(s): INR, PROTIME in the last 168 hours. Cardiac Enzymes: No results for input(s): CKTOTAL, CKMB, CKMBINDEX, TROPONINI in the last 168 hours. BNP (last 3 results) No results for input(s): PROBNP in the last 8760 hours. HbA1C: No results for input(s): HGBA1C in the last 72 hours. CBG: Recent Labs  Lab 10/10/20 0105 10/10/20 0544  GLUCAP 111* 96   Lipid  Profile: Recent Labs    10/10/20 0421  CHOL 149  HDL 48  LDLCALC 78  TRIG 117  CHOLHDL 3.1   Thyroid Function Tests: No results for input(s): TSH, T4TOTAL, FREET4, T3FREE, THYROIDAB in the last 72 hours. Anemia Panel: No results for input(s): VITAMINB12, FOLATE, FERRITIN, TIBC, IRON, RETICCTPCT in the last 72 hours. Urine analysis:    Component Value Date/Time   COLORURINE YELLOW 10/09/2020 2014  APPEARANCEUR CLEAR 10/09/2020 2014   LABSPEC 1.017 10/09/2020 2014   PHURINE 7.5 10/09/2020 2014   [place] 10/09/2020 2014   HGBUR NEGATIVE 10/09/2020 2014   [place] NEGATIVE 10/09/2020 2014   [place] NEGATIVE 10/09/2020 2014   PROTEINUR NEGATIVE 10/09/2020 2014   NITRITE NEGATIVE 10/09/2020 2014   LEUKOCYTESUR TRACE (A) 10/09/2020 2014    Radiological Exams on Admission - Personally Reviewed: CT ABDOMEN PELVIS W CONTRAST  Result Date: 10/09/2020 CLINICAL DATA:  Upper abdominal pain with vomiting EXAM: CT ABDOMEN AND PELVIS WITH CONTRAST TECHNIQUE: Multidetector CT imaging of the abdomen and pelvis was performed using the standard protocol following bolus administration of intravenous contrast. CONTRAST:  45m OMNIPAQUE IOHEXOL 300 MG/ML  SOLN COMPARISON:  MRI 06/19/2017, CT 04/05/2017 FINDINGS: Lower chest: Lung bases demonstrate streaky atelectasis or scarring. Mild lower lobe bronchiectasis. No pleural effusion. Normal cardiac size. Hepatobiliary: No focal liver abnormality is seen. Status post cholecystectomy. No biliary dilatation. Pancreas: Moderate peripancreatic inflammatory change consistent with acute pancreatitis. Homogeneous enhancement and no evidence for necrosis at this time. Isolated calcification at the pancreatic head as before. Spleen: Normal in size without focal abnormality. Adrenals/Urinary Tract: Adrenal glands are unremarkable. Kidneys show no hydronephrosis. Small nonobstructing stone lower pole left kidney. Stomach/Bowel: Stomach is within normal  limits. Appendix appears normal. No evidence of bowel wall thickening, distention, or inflammatory changes. Vascular/Lymphatic: Moderate aortic atherosclerosis. No aneurysm. Patency of the portal and splenic veins. No suspicious adenopathy Reproductive: Uterus and bilateral adnexa are unremarkable. Other: No free air. Small fluid in the right anterior pararenal space. Musculoskeletal: Chronic superior endplate deformity at L1. IMPRESSION: 1. Moderate peripancreatic inflammatory change consistent with acute pancreatitis. No evidence for necrosis or organized fluid collection at this time. 2. Nonobstructing stone in the left kidney. Aortic Atherosclerosis (ICD10-I70.0). Electronically Signed   By: KDonavan FoilM.D.   On: 10/09/2020 21:55      Assessment/Plan Principal Problem:   Acute pancreatitis without infection or necrosis   Patient presenting with evidence of acute pancreatitis with markedly elevated lipase and evidence of peripancreatic inflammation on CT imaging of the abdomen and pelvis.  Patient has been made n.p.o. for now.  Hydrating patient with intravenous isotonic fluids  Once patient's abdominal pain begins to improve we will slowly advance diet as tolerated.  Concerning the etiology, patient has experienced 2 previous episodes of pancreatitis in the past and has undergone cholecystectomy in 2019 to attempt to prevent further episodes from happening.  CT imaging of abdomen and pelvis reveals no evidence of biliary disease  Lipid panel and toxicology screens pending  Hepatic function panel essentially unremarkable  At this point the etiology of this episode is unclear.  Active Problems:   Essential hypertension   Continue home regimen of antihypertensive therapy    Type 2 diabetes mellitus with complication, without long-term current use of insulin ([place]  . Patient been placed on Accu-Cheks before every meal and nightly with sliding scale insulin . Holding home  regimen of hypoglycemics . Hemoglobin A1C ordered . Diabetic Diet    Mixed diabetic hyperlipidemia associated with type 2 diabetes mellitus ([place]  . Continuing home regimen of lipid lowering therapy.    Nicotine dependence, cigarettes, uncomplicated  . Patient is being counseled daily on smoking cessation. . Providing patient with nicotine replacement therapy during this hospitalization.      Code Status:  Full code Family Communication: deferred   Status is: Inpatient  Remains inpatient appropriate because:Ongoing diagnostic testing needed not appropriate for outpatient work up,  IV treatments appropriate due to intensity of illness or inability to take PO and Inpatient level of care appropriate due to severity of illness   Dispo: The patient is from: Home              Anticipated d/c is to: Home              Patient currently is not medically stable to d/c.   Difficult to place patient No        Vernelle Emerald MD [facility] Pager [phone]  If 7PM-7AM, please contact night-coverage [email or web address] Use universal [employer] password for that web site. If you do not have the password, please call the hospital operator.  10/10/2020, 8:01 AM

## 2020-10-10 NOTE — Progress Notes (Signed)
PROGRESS NOTE  Robin Brady VEH:[id number] DOB: 04/15/1946   PCP: Richmond Campbell., PA-C  Patient is from: Home.  DOA: 10/09/2020 LOS: 1  Chief complaints: Nausea, vomiting and abdominal pain  Brief Narrative / Interim history: 75 year old F with PMH of NIDDM-2, HTN, HLD, cholecystectomy, pancreatitis, tobacco use disorder and constipation presenting with nausea, vomiting and abdominal pain, and found to have acute uncomplicated pancreatitis with lipase elevated to 3862.  Subjective: Seen and examined earlier this morning.  No major events this morning.  Reports improvement in her pain and nausea.  No further emesis.  Denies chest pain, shortness of breath, fever, chills or UTI symptoms.  Denies new medication no alcohol.  Objective: Vitals:   10/09/20 2345 10/10/20 0122 10/10/20 0544 10/10/20 0942  BP: (!) 156/67 132/63 115/60 (!) 115/51  Pulse: (!) 55 (!) 50 (!) 51 (!) 58  Resp: 16 18 17 18   Temp: (!) 97.5 F (36.4 C) 98 F (36.7 C) 97.9 F (36.6 C) (!) 97.5 F (36.4 C)  TempSrc: Oral   Oral  SpO2: 98% 97% 96% 94%  Weight:      Height:        Intake/Output Summary (Last 24 hours) at 10/10/2020 0953 Last data filed at 10/10/2020 0539 Gross per 24 hour  Intake 2178.55 ml  Output --  Net 2178.55 ml   Filed Weights   10/09/20 1857  Weight: 68.9 kg    Examination:  GENERAL: No apparent distress.  Nontoxic. HEENT: MMM.  Vision and hearing grossly intact.  NECK: Supple.  No apparent JVD.  RESP:  No IWOB.  Fair aeration bilaterally. CVS:  RRR. Heart sounds normal.  ABD/GI/GU: BS+. Abd soft.  Tenderness across upper abdomen mainly on the epigastrium. MSK/EXT:  Moves extremities. No apparent deformity. No edema.  SKIN: no apparent skin lesion or wound NEURO: Awake, alert and oriented appropriately.  No apparent focal neuro deficit. PSYCH: Calm. Normal affect.  Procedures:  None  Microbiology summarized: COVID-19 and influenza PCR nonreactive.  Assessment &  Plan: Acute idiopathic and uncomplicated pancreatitis without necrosis or infection-improving.  Lipase 3862>> 1256.  LFT within normal.  AKI and GI symptoms improving.  Triglyceride 117.  Calcium within normal.  Denies heavy alcohol use.  Has cholecystectomy. -Continue supportive care with IV fluid, analgesics, antiemetics and clear liquid diet  Controlled NIDDM-2 with hyperlipidemia: A1c 6.4%. Recent Labs  Lab 10/10/20 0105 10/10/20 0544  GLUCAP 111* 96  -Continue SSI-moderate -Continue home Crestor  CKD-3A/azotemia: Cr slightly up likely from dehydration in the setting of GI loss.  Cr 1.19 in 08/2020.  Recent Labs    10/09/20 1919 10/10/20 0421  BUN 33* 24*  CREATININE 1.35* 1.12*  -Continue IV fluid hydration as above -Avoid nephrotoxic meds  Macrocytic anemia: Hgb dropped about 2 g likely dilutional. Recent Labs    10/09/20 1919 10/10/20 0421  HGB 13.3 11.4*  -Check anemia panel in the morning -Continue monitoring   Essential hypertension: Normotensive. -Continue home metoprolol  Tobacco use disorder: Reports smoking about 1 to 2 cigarettes a day. -Encourage tobacco cessation -Declined nicotine patch.  Leukocytosis: Likely demargination  Overweight Body mass index is 26.09 kg/m.         DVT prophylaxis:  enoxaparin (LOVENOX) injection 40 mg Start: 10/10/20 1000  Code Status: Full code Family Communication: Patient and/or RN. Available if any question.  Level of care: Med-Surg Status is: Inpatient  Remains inpatient appropriate because:IV treatments appropriate due to intensity of illness or inability to take PO and  Inpatient level of care appropriate due to severity of illness   Dispo: The patient is from: Home              Anticipated d/c is to: Home              Patient currently is not medically stable to d/c.   Difficult to place patient No       Consultants:  None   Sch Meds:  Scheduled Meds: . aspirin EC  81 mg Oral Q M,W,F  .  enoxaparin (LOVENOX) injection  40 mg Subcutaneous Q24H  . insulin aspart  0-15 Units Subcutaneous Q6H  . metoprolol succinate  25 mg Oral Daily  . prednisoLONE acetate  1 drop Right Eye Daily  . rosuvastatin  5 mg Oral Q M,W,F   Continuous Infusions: . sodium chloride 1,000 mL (10/10/20 0450)   PRN Meds:.acetaminophen **OR** acetaminophen, morphine injection **OR** morphine injection, ondansetron (ZOFRAN) IV, ondansetron **OR** ondansetron (ZOFRAN) IV, polyethylene glycol  Antimicrobials: Anti-infectives (From admission, onward)   None       I have personally reviewed the following labs and images: CBC: Recent Labs  Lab 10/09/20 1919 10/10/20 0421  WBC 13.0* 11.1*  NEUTROABS  --  7.4  HGB 13.3 11.4*  HCT 40.0 34.7*  MCV 98.5 103.6*  PLT 254 194   BMP &GFR Recent Labs  Lab 10/09/20 1919 10/10/20 0421  NA 141 141  K 4.0 4.0  CL 104 112*  CO2 27 22  GLUCOSE 120* 98  BUN 33* 24*  CREATININE 1.35* 1.12*  CALCIUM 10.2 8.7*  MG  --  1.9   Estimated Creatinine Clearance: 41.4 mL/min (A) (by C-G formula based on SCr of 1.12 mg/dL (H)). Liver & Pancreas: Recent Labs  Lab 10/09/20 1919 10/10/20 0421  AST 20 23  ALT 12 11  ALKPHOS 46 41  BILITOT 0.3 0.7  PROT 7.2 5.8*  ALBUMIN 4.4 3.2*   Recent Labs  Lab 10/09/20 1919 10/10/20 0421  LIPASE 3,862* 1,256*   No results for input(s): AMMONIA in the last 168 hours. Diabetic: Recent Labs    10/10/20 0421  HGBA1C 6.4*   Recent Labs  Lab 10/10/20 0105 10/10/20 0544  GLUCAP 111* 96   Cardiac Enzymes: No results for input(s): CKTOTAL, CKMB, CKMBINDEX, TROPONINI in the last 168 hours. No results for input(s): PROBNP in the last 8760 hours. Coagulation Profile: No results for input(s): INR, PROTIME in the last 168 hours. Thyroid Function Tests: No results for input(s): TSH, T4TOTAL, FREET4, T3FREE, THYROIDAB in the last 72 hours. Lipid Profile: Recent Labs    10/10/20 0421  CHOL 149  HDL 48  LDLCALC  78  TRIG [id number]  CHOLHDL 3.1   Anemia Panel: No results for input(s): VITAMINB12, FOLATE, FERRITIN, TIBC, IRON, RETICCTPCT in the last 72 hours. Urine analysis:    Component Value Date/Time   COLORURINE YELLOW 10/09/2020 2014   APPEARANCEUR CLEAR 10/09/2020 2014   LABSPEC 1.017 10/09/2020 2014   PHURINE 7.5 10/09/2020 2014   GLUCOSEU NEGATIVE 10/09/2020 2014   HGBUR NEGATIVE 10/09/2020 2014   BILIRUBINUR NEGATIVE 10/09/2020 2014   KETONESUR NEGATIVE 10/09/2020 2014   PROTEINUR NEGATIVE 10/09/2020 2014   NITRITE NEGATIVE 10/09/2020 2014   LEUKOCYTESUR TRACE (A) 10/09/2020 2014   Sepsis Labs: Invalid input(s): PROCALCITONIN, LACTICIDVEN  Microbiology: Recent Results (from the past 240 hour(s))  Resp Panel by RT-PCR (Flu A&B, Covid) Nasopharyngeal Swab     Status: None   Collection Time: 10/09/20  9:57 PM  Specimen: Nasopharyngeal Swab; Nasopharyngeal(NP) swabs in vial transport medium  Result Value Ref Range Status   SARS Coronavirus 2 by RT PCR NEGATIVE NEGATIVE Final    Comment: (NOTE) SARS-CoV-2 target nucleic acids are NOT DETECTED.  The SARS-CoV-2 RNA is generally detectable in upper respiratory specimens during the acute phase of infection. The lowest concentration of SARS-CoV-2 viral copies this assay can detect is 138 copies/mL. A negative result does not preclude SARS-Cov-2 infection and should not be used as the sole basis for treatment or other patient management decisions. A negative result may occur with  improper specimen collection/handling, submission of specimen other than nasopharyngeal swab, presence of viral mutation(s) within the areas targeted by this assay, and inadequate number of viral copies(<138 copies/mL). A negative result must be combined with clinical observations, patient history, and epidemiological information. The expected result is Negative.  Fact Sheet for Patients:  BloggerCourse.com  Fact Sheet for Healthcare  Providers:  SeriousBroker.it  This test is no t yet approved or cleared by the [place] FDA and  has been authorized for detection and/or diagnosis of SARS-CoV-2 by FDA under an Emergency Use Authorization (EUA). This EUA will remain  in effect (meaning this test can be used) for the duration of the COVID-19 declaration under Section 564(b)(1) of the Act, 21 U.S.C.section 360bbb-3(b)(1), unless the authorization is terminated  or revoked sooner.       Influenza A by PCR NEGATIVE NEGATIVE Final   Influenza B by PCR NEGATIVE NEGATIVE Final    Comment: (NOTE) The Xpert Xpress SARS-CoV-2/FLU/RSV plus assay is intended as an aid in the diagnosis of influenza from Nasopharyngeal swab specimens and should not be used as a sole basis for treatment. Nasal washings and aspirates are unacceptable for Xpert Xpress SARS-CoV-2/FLU/RSV testing.  Fact Sheet for Patients: BloggerCourse.com  Fact Sheet for Healthcare Providers: SeriousBroker.it  This test is not yet approved or cleared by the [place] FDA and has been authorized for detection and/or diagnosis of SARS-CoV-2 by FDA under an Emergency Use Authorization (EUA). This EUA will remain in effect (meaning this test can be used) for the duration of the COVID-19 declaration under Section 564(b)(1) of the Act, 21 U.S.C. section 360bbb-3(b)(1), unless the authorization is terminated or revoked.  Performed at [facility]     Radiology Studies: CT ABDOMEN PELVIS W CONTRAST  Result Date: 10/09/2020 CLINICAL DATA:  Upper abdominal pain with vomiting EXAM: CT ABDOMEN AND PELVIS WITH CONTRAST TECHNIQUE: Multidetector CT imaging of the abdomen and pelvis was performed using the standard protocol following bolus administration of intravenous contrast. CONTRAST:  65mL OMNIPAQUE IOHEXOL 300 MG/ML  SOLN COMPARISON:  MRI 06/19/2017, CT 04/05/2017  FINDINGS: Lower chest: Lung bases demonstrate streaky atelectasis or scarring. Mild lower lobe bronchiectasis. No pleural effusion. Normal cardiac size. Hepatobiliary: No focal liver abnormality is seen. Status post cholecystectomy. No biliary dilatation. Pancreas: Moderate peripancreatic inflammatory change consistent with acute pancreatitis. Homogeneous enhancement and no evidence for necrosis at this time. Isolated calcification at the pancreatic head as before. Spleen: Normal in size without focal abnormality. Adrenals/Urinary Tract: Adrenal glands are unremarkable. Kidneys show no hydronephrosis. Small nonobstructing stone lower pole left kidney. Stomach/Bowel: Stomach is within normal limits. Appendix appears normal. No evidence of bowel wall thickening, distention, or inflammatory changes. Vascular/Lymphatic: Moderate aortic atherosclerosis. No aneurysm. Patency of the portal and splenic veins. No suspicious adenopathy Reproductive: Uterus and bilateral adnexa are unremarkable. Other: No free air. Small fluid in the right anterior pararenal space. Musculoskeletal: Chronic superior  endplate deformity at L1. IMPRESSION: 1. Moderate peripancreatic inflammatory change consistent with acute pancreatitis. No evidence for necrosis or organized fluid collection at this time. 2. Nonobstructing stone in the left kidney. Aortic Atherosclerosis (ICD10-I70.0). Electronically Signed   By: Jasmine Pang M.D.   On: 10/09/2020 21:55    Admission after midnight.  No charge for this service.   Taliesin Hartlage T. Veniamin Kincaid [facility]  If 7PM-7AM, please contact night-coverage [email or web address] 10/10/2020, 9:53 AM

## 2020-10-11 DIAGNOSIS — D649 Anemia, unspecified: Secondary | ICD-10-CM

## 2020-10-11 DIAGNOSIS — I10 Essential (primary) hypertension: Secondary | ICD-10-CM | POA: Diagnosis not present

## 2020-10-11 DIAGNOSIS — N1831 Chronic kidney disease, stage 3a: Secondary | ICD-10-CM

## 2020-10-11 DIAGNOSIS — K8500 Idiopathic acute pancreatitis without necrosis or infection: Secondary | ICD-10-CM | POA: Diagnosis not present

## 2020-10-11 DIAGNOSIS — F17210 Nicotine dependence, cigarettes, uncomplicated: Secondary | ICD-10-CM | POA: Diagnosis not present

## 2020-10-11 DIAGNOSIS — E1169 Type 2 diabetes mellitus with other specified complication: Secondary | ICD-10-CM | POA: Diagnosis not present

## 2020-10-11 DIAGNOSIS — D72825 Bandemia: Secondary | ICD-10-CM

## 2020-10-11 LAB — COMPREHENSIVE METABOLIC PANEL
ALT: 10 U/L (ref 0–44)
AST: 20 U/L (ref 15–41)
Albumin: 3 g/dL — ABNORMAL LOW (ref 3.5–5.0)
Alkaline Phosphatase: 46 U/L (ref 38–126)
Anion gap: 6 (ref 5–15)
BUN: 15 mg/dL (ref 8–23)
CO2: 22 mmol/L (ref 22–32)
Calcium: 8.7 mg/dL — ABNORMAL LOW (ref 8.9–10.3)
Chloride: 114 mmol/L — ABNORMAL HIGH (ref 98–111)
Creatinine, Ser: 1.06 mg/dL — ABNORMAL HIGH (ref 0.44–1.00)
GFR, Estimated: 55 mL/min — ABNORMAL LOW (ref >60)
Glucose, Bld: 73 mg/dL (ref 70–99)
Potassium: 3.8 mmol/L (ref 3.5–5.1)
Sodium: 142 mmol/L (ref 135–145)
Total Bilirubin: 0.4 mg/dL (ref 0.3–1.2)
Total Protein: 5.8 g/dL — ABNORMAL LOW (ref 6.5–8.1)

## 2020-10-11 LAB — CBC
HCT: 33.7 % — ABNORMAL LOW (ref 36.0–46.0)
Hemoglobin: 11 g/dL — ABNORMAL LOW (ref 12.0–15.0)
MCH: 33.4 pg (ref 26.0–34.0)
MCHC: 32.6 g/dL (ref 30.0–36.0)
MCV: 102.4 fL — ABNORMAL HIGH (ref 80.0–100.0)
Platelets: 191 10*3/uL (ref 150–400)
RBC: 3.29 MIL/uL — ABNORMAL LOW (ref 3.87–5.11)
RDW: 12.9 % (ref 11.5–15.5)
WBC: 10.4 10*3/uL (ref 4.0–10.5)
nRBC: 0 % (ref 0.0–0.2)

## 2020-10-11 LAB — GLUCOSE, CAPILLARY
Glucose-Capillary: 125 mg/dL — ABNORMAL HIGH (ref 70–99)
Glucose-Capillary: 134 mg/dL — ABNORMAL HIGH (ref 70–99)
Glucose-Capillary: 136 mg/dL — ABNORMAL HIGH (ref 70–99)
Glucose-Capillary: 68 mg/dL — ABNORMAL LOW (ref 70–99)
Glucose-Capillary: 78 mg/dL (ref 70–99)

## 2020-10-11 LAB — FOLATE: Folate: 50.7 ng/mL (ref >5.9)

## 2020-10-11 LAB — IRON AND TIBC
Iron: 43 ug/dL (ref 28–170)
Saturation Ratios: 16 % (ref 10.4–31.8)
TIBC: 272 ug/dL (ref 250–450)
UIBC: 229 ug/dL

## 2020-10-11 LAB — RETICULOCYTES
Immature Retic Fract: 5.3 % (ref 2.3–15.9)
RBC.: 3.31 MIL/uL — ABNORMAL LOW (ref 3.87–5.11)
Retic Count, Absolute: 42 10*3/uL (ref 19.0–186.0)
Retic Ct Pct: 1.3 % (ref 0.4–3.1)

## 2020-10-11 LAB — VITAMIN B12: Vitamin B-12: 1642 pg/mL — ABNORMAL HIGH (ref 180–914)

## 2020-10-11 LAB — LIPASE, BLOOD: Lipase: 1040 U/L — ABNORMAL HIGH (ref 11–51)

## 2020-10-11 LAB — FERRITIN: Ferritin: 65 ng/mL (ref 11–307)

## 2020-10-11 NOTE — Progress Notes (Signed)
PROGRESS NOTE  Robin Brady VFI:[id number] DOB: Sep 29, 1945   PCP: Richmond Campbell., PA-C  Patient is from: Home.  DOA: 10/09/2020 LOS: 2  Chief complaints: Nausea, vomiting and abdominal pain  Brief Narrative / Interim history: 75 year old F with PMH of NIDDM-2, HTN, HLD, cholecystectomy, pancreatitis, tobacco use disorder and constipation presenting with nausea, vomiting and abdominal pain, and found to have acute uncomplicated pancreatitis with lipase elevated to 3862.  Pancreatitis improving.  Subjective: Seen and examined earlier this morning.  No major events overnight of this morning.  Reports improvement in her pain.  Denies nausea or vomiting.  Denies chest pain or shortness of breath.  Objective: Vitals:   10/10/20 0942 10/10/20 1409 10/10/20 2126 10/11/20 0430  BP: (!) 115/51 (!) 117/56 (!) 126/52 131/60  Pulse: (!) 58 (!) 53 (!) 57 (!) 59  Resp: 18 18 16 16   Temp: (!) 97.5 F (36.4 C) 98.6 F (37 C) 98.5 F (36.9 C) 98.3 F (36.8 C)  TempSrc: Oral Oral Oral Oral  SpO2: 94% 96% 95% 95%  Weight:      Height:        Intake/Output Summary (Last 24 hours) at 10/11/2020 1030 Last data filed at 10/11/2020 0509 Gross per 24 hour  Intake 2260.37 ml  Output 1600 ml  Net 660.37 ml   Filed Weights   10/09/20 1857  Weight: 68.9 kg    Examination:  GENERAL: No apparent distress.  Nontoxic. HEENT: MMM.  Vision and hearing grossly intact.  NECK: Supple.  No apparent JVD.  RESP:  No IWOB.  Fair aeration bilaterally. CVS:  RRR. Heart sounds normal.  ABD/GI/GU: BS+. Abd soft.  Mild discomfort across upper abdomen with palpation. MSK/EXT:  Moves extremities. No apparent deformity. No edema.  SKIN: no apparent skin lesion or wound NEURO: Awake, alert and oriented appropriately.  No apparent focal neuro deficit. PSYCH: Calm. Normal affect.  Procedures:  None  Microbiology summarized: COVID-19 and influenza PCR nonreactive.  Assessment & Plan: Acute  idiopathic and uncomplicated pancreatitis without necrosis or infection-improving.  Lipase 3862>>> 1040.  LFT within normal.  AKI and GI symptoms improving.  Triglyceride 117.  Calcium within normal.  Denies heavy alcohol use.  Has cholecystectomy. -Start clear liquid diet.  -May discontinue IV fluid if she can maintain good hydration orally -Analgesics and antiemetics as needed -Recheck lipase in the morning  Controlled NIDDM-2 with hyperlipidemia: A1c 6.4%. Recent Labs  Lab 10/10/20 0544 10/10/20 1305 10/10/20 1745 10/10/20 2103 10/11/20 0047  GLUCAP 96 93 79 75 78  -Continue SSI-moderate -Continue home Crestor  CKD-3A/azotemia: Cr slightly up likely from dehydration in the setting of GI loss.  Cr 1.19 in 08/2020.  Recent Labs    10/09/20 1919 10/10/20 0421 10/11/20 0346  BUN 33* 24* 15  CREATININE 1.35* 1.12* 1.06*  -On IV fluid -Avoid nephrotoxic meds  Macrocytic anemia: Hgb dropped about 2 g likely dilutional. Recent Labs    10/09/20 1919 10/10/20 0421 10/11/20 0346  HGB 13.3 11.4* 11.0*  -Check anemia panel in the morning -Continue monitoring   Essential hypertension: Normotensive. -Continue home metoprolol  Tobacco use disorder: Reports smoking about 1 to 2 cigarettes a day. -Encourage tobacco cessation -Declined nicotine patch.  Leukocytosis: Likely demargination.  Resolved.  Overweight Body mass index is 26.09 kg/m.         DVT prophylaxis:  enoxaparin (LOVENOX) injection 40 mg Start: 10/10/20 1000  Code Status: Full code Family Communication: Patient and/or RN. Available if any question.  Level of  care: Med-Surg Status is: Inpatient  Remains inpatient appropriate because:IV treatments appropriate due to intensity of illness or inability to take PO and Inpatient level of care appropriate due to severity of illness   Dispo: The patient is from: Home              Anticipated d/c is to: Home likely on 4/13              Patient currently is not  medically stable to d/c.   Difficult to place patient No       Consultants:  None   Sch Meds:  Scheduled Meds: . aspirin EC  81 mg Oral Q M,W,F  . enoxaparin (LOVENOX) injection  40 mg Subcutaneous Q24H  . insulin aspart  0-15 Units Subcutaneous Q6H  . metoprolol succinate  25 mg Oral Daily  . prednisoLONE acetate  1 drop Right Eye Daily  . rosuvastatin  5 mg Oral Q M,W,F   Continuous Infusions: . sodium chloride 1,000 mL (10/11/20 0510)   PRN Meds:.acetaminophen **OR** acetaminophen, morphine injection **OR** morphine injection, ondansetron (ZOFRAN) IV, ondansetron **OR** ondansetron (ZOFRAN) IV, polyethylene glycol  Antimicrobials: Anti-infectives (From admission, onward)   None       I have personally reviewed the following labs and images: CBC: Recent Labs  Lab 10/09/20 1919 10/10/20 0421 10/11/20 0346  WBC 13.0* 11.1* 10.4  NEUTROABS  --  7.4  --   HGB 13.3 11.4* 11.0*  HCT 40.0 34.7* 33.7*  MCV 98.5 103.6* 102.4*  PLT 254 194 191   BMP &GFR Recent Labs  Lab 10/09/20 1919 10/10/20 0421 10/11/20 0346  NA 141 141 142  K 4.0 4.0 3.8  CL 104 112* 114*  CO2 27 22 22   GLUCOSE 120* 98 73  BUN 33* 24* 15  CREATININE 1.35* 1.12* 1.06*  CALCIUM 10.2 8.7* 8.7*  MG  --  1.9  --    Estimated Creatinine Clearance: 43.7 mL/min (A) (by C-G formula based on SCr of 1.06 mg/dL (H)). Liver & Pancreas: Recent Labs  Lab 10/09/20 1919 10/10/20 0421 10/11/20 0346  AST 20 23 20   ALT 12 11 10   ALKPHOS 46 41 46  BILITOT 0.3 0.7 0.4  PROT 7.2 5.8* 5.8*  ALBUMIN 4.4 3.2* 3.0*   Recent Labs  Lab 10/09/20 1919 10/10/20 0421 10/11/20 0346  LIPASE 3,862* 1,256* 1,040*   No results for input(s): AMMONIA in the last 168 hours. Diabetic: Recent Labs    10/10/20 0421  HGBA1C 6.4*   Recent Labs  Lab 10/10/20 0544 10/10/20 1305 10/10/20 1745 10/10/20 2103 10/11/20 0047  GLUCAP 96 93 79 75 78   Cardiac Enzymes: No results for input(s): CKTOTAL, CKMB,  CKMBINDEX, TROPONINI in the last 168 hours. No results for input(s): PROBNP in the last 8760 hours. Coagulation Profile: No results for input(s): INR, PROTIME in the last 168 hours. Thyroid Function Tests: No results for input(s): TSH, T4TOTAL, FREET4, T3FREE, THYROIDAB in the last 72 hours. Lipid Profile: Recent Labs    10/10/20 0421  CHOL 149  HDL 48  LDLCALC 78  TRIG 117  CHOLHDL 3.1   Anemia Panel: Recent Labs    10/11/20 0346  VITAMINB12 1,642*  FERRITIN 65  TIBC 272  IRON 43   Urine analysis:    Component Value Date/Time   COLORURINE YELLOW 10/09/2020 2014   APPEARANCEUR CLEAR 10/09/2020 2014   LABSPEC 1.017 10/09/2020 2014   PHURINE 7.5 10/09/2020 2014   GLUCOSEU NEGATIVE 10/09/2020 2014   HGBUR NEGATIVE  10/09/2020 2014   BILIRUBINUR NEGATIVE 10/09/2020 2014   KETONESUR NEGATIVE 10/09/2020 2014   PROTEINUR NEGATIVE 10/09/2020 2014   NITRITE NEGATIVE 10/09/2020 2014   LEUKOCYTESUR TRACE (A) 10/09/2020 2014   Sepsis Labs: Invalid input(s): PROCALCITONIN, LACTICIDVEN  Microbiology: Recent Results (from the past 240 hour(s))  Resp Panel by RT-PCR (Flu A&B, Covid) Nasopharyngeal Swab     Status: None   Collection Time: 10/09/20  9:57 PM   Specimen: Nasopharyngeal Swab; Nasopharyngeal(NP) swabs in vial transport medium  Result Value Ref Range Status   SARS Coronavirus 2 by RT PCR NEGATIVE NEGATIVE Final    Comment: (NOTE) SARS-CoV-2 target nucleic acids are NOT DETECTED.  The SARS-CoV-2 RNA is generally detectable in upper respiratory specimens during the acute phase of infection. The lowest concentration of SARS-CoV-2 viral copies this assay can detect is 138 copies/mL. A negative result does not preclude SARS-Cov-2 infection and should not be used as the sole basis for treatment or other patient management decisions. A negative result may occur with  improper specimen collection/handling, submission of specimen other than nasopharyngeal swab, presence of  viral mutation(s) within the areas targeted by this assay, and inadequate number of viral copies(<138 copies/mL). A negative result must be combined with clinical observations, patient history, and epidemiological information. The expected result is Negative.  Fact Sheet for Patients:  BloggerCourse.com  Fact Sheet for Healthcare Providers:  SeriousBroker.it  This test is no t yet approved or cleared by the [place] FDA and  has been authorized for detection and/or diagnosis of SARS-CoV-2 by FDA under an Emergency Use Authorization (EUA). This EUA will remain  in effect (meaning this test can be used) for the duration of the COVID-19 declaration under Section 564(b)(1) of the Act, 21 U.S.C.section 360bbb-3(b)(1), unless the authorization is terminated  or revoked sooner.       Influenza A by PCR NEGATIVE NEGATIVE Final   Influenza B by PCR NEGATIVE NEGATIVE Final    Comment: (NOTE) The Xpert Xpress SARS-CoV-2/FLU/RSV plus assay is intended as an aid in the diagnosis of influenza from Nasopharyngeal swab specimens and should not be used as a sole basis for treatment. Nasal washings and aspirates are unacceptable for Xpert Xpress SARS-CoV-2/FLU/RSV testing.  Fact Sheet for Patients: BloggerCourse.com  Fact Sheet for Healthcare Providers: SeriousBroker.it  This test is not yet approved or cleared by the [place] FDA and has been authorized for detection and/or diagnosis of SARS-CoV-2 by FDA under an Emergency Use Authorization (EUA). This EUA will remain in effect (meaning this test can be used) for the duration of the COVID-19 declaration under Section 564(b)(1) of the Act, 21 U.S.C. section 360bbb-3(b)(1), unless the authorization is terminated or revoked.  Performed at [facility]     Radiology Studies: No results found.  Admission after  midnight.  No charge for this service.   Darlinda Bellows T. Issak Goley [facility]  If 7PM-7AM, please contact night-coverage [email or web address] 10/11/2020, 10:30 AM

## 2020-10-12 DIAGNOSIS — K8500 Idiopathic acute pancreatitis without necrosis or infection: Secondary | ICD-10-CM | POA: Diagnosis not present

## 2020-10-12 DIAGNOSIS — I10 Essential (primary) hypertension: Secondary | ICD-10-CM | POA: Diagnosis not present

## 2020-10-12 DIAGNOSIS — E1169 Type 2 diabetes mellitus with other specified complication: Secondary | ICD-10-CM | POA: Diagnosis not present

## 2020-10-12 DIAGNOSIS — F17210 Nicotine dependence, cigarettes, uncomplicated: Secondary | ICD-10-CM | POA: Diagnosis not present

## 2020-10-12 LAB — GLUCOSE, CAPILLARY
Glucose-Capillary: 103 mg/dL — ABNORMAL HIGH (ref 70–99)
Glucose-Capillary: 128 mg/dL — ABNORMAL HIGH (ref 70–99)
Glucose-Capillary: 210 mg/dL — ABNORMAL HIGH (ref 70–99)
Glucose-Capillary: 98 mg/dL (ref 70–99)

## 2020-10-12 LAB — LIPASE, BLOOD: Lipase: 1554 U/L — ABNORMAL HIGH (ref 11–51)

## 2020-10-12 NOTE — Progress Notes (Signed)
PROGRESS NOTE  Robin Brady:[id number] DOB: Nov 14, 1945   PCP: Richmond Campbell., PA-C  Patient is from: Home.  DOA: 10/09/2020 LOS: 3  Chief complaints: Nausea, vomiting and abdominal pain  Brief Narrative / Interim history: 75 year old F with PMH of NIDDM-2, HTN, HLD, cholecystectomy, pancreatitis, tobacco use disorder and constipation presenting with nausea, vomiting and abdominal pain, and found to have acute uncomplicated pancreatitis with lipase elevated to 3862.   Subjective: Seen and examined earlier this morning.  No major events overnight or this morning.  No complaints.  She denies nausea, vomiting, abdominal pain, chest pain or shortness of breath.  Denies fever or chills.  Objective: Vitals:   10/11/20 1333 10/11/20 2136 10/12/20 0550 10/12/20 1343  BP: 127/62 (!) 151/66 131/61 (!) 112/58  Pulse: (!) 55 (!) 56 60 (!) 56  Resp: 18 17 17 18   Temp: 98.7 F (37.1 C) 99.4 F (37.4 C) 99.1 F (37.3 C) 98.4 F (36.9 C)  TempSrc: Oral Oral Oral Oral  SpO2: 98% 97% 95% 97%  Weight:      Height:        Intake/Output Summary (Last 24 hours) at 10/12/2020 1607 Last data filed at 10/12/2020 1400 Gross per 24 hour  Intake 1237.87 ml  Output 2400 ml  Net -1162.13 ml   Filed Weights   10/09/20 1857  Weight: 68.9 kg    Examination:  GENERAL: No apparent distress.  Nontoxic. HEENT: MMM.  Vision and hearing grossly intact.  NECK: Supple.  No apparent JVD.  RESP:  No IWOB.  Fair aeration bilaterally. CVS:  RRR. Heart sounds normal.  ABD/GI/GU: BS+. Abd soft, NTND.  Mild discomfort across upper abdomen with palpation MSK/EXT:  Moves extremities. No apparent deformity. No edema.  SKIN: no apparent skin lesion or wound NEURO: Awake, alert and oriented appropriately.  No apparent focal neuro deficit. PSYCH: Calm. Normal affect.   Procedures:  None  Microbiology summarized: COVID-19 and influenza PCR nonreactive.  Assessment & Plan: Acute idiopathic and  uncomplicated pancreatitis without necrosis or infection-improving.  Lipase 3862>>> 1040>> 1554.  LFT within normal.  AKI and GI symptoms improving.  Triglyceride 117.  Calcium within normal.  Denies heavy alcohol use.  Has cholecystectomy.  Clinically improving despite uptrend in her lipase. -Continue clear liquid diet today -Analgesics and antiemetics as needed -Recheck lipase in the morning  Controlled NIDDM-2 with hyperlipidemia: A1c 6.4%. Recent Labs  Lab 10/11/20 1748 10/11/20 1831 10/11/20 2348 10/12/20 0549 10/12/20 1153  GLUCAP 68* 136* 134* 98 103*  -Continue SSI-moderate -Continue home Crestor  CKD-3A/azotemia: Cr slightly up likely from dehydration in the setting of GI loss.  Cr 1.19 in 08/2020.  Recent Labs    10/09/20 1919 10/10/20 0421 10/11/20 0346  BUN 33* 24* 15  CREATININE 1.35* 1.12* 1.06*  -Avoid nephrotoxic meds -Recheck in the morning  Macrocytic anemia: Hgb dropped about 2 g likely dilutional.  Anemia panel normal. Recent Labs    10/09/20 1919 10/10/20 0421 10/11/20 0346  HGB 13.3 11.4* 11.0*  -Recheck in the morning   Essential hypertension: Normotensive. -Continue home metoprolol  Tobacco use disorder: Reports smoking about 1 to 2 cigarettes a day. -Encourage tobacco cessation -Declined nicotine patch.  Leukocytosis: Likely demargination.  Resolved.  Overweight Body mass index is 26.09 kg/m.         DVT prophylaxis:  enoxaparin (LOVENOX) injection 40 mg Start: 10/10/20 1000  Code Status: Full code Family Communication: Patient and/or RN. Available if any question.  Level of care: Med-Surg  Status is: Inpatient  Remains inpatient appropriate because:IV treatments appropriate due to intensity of illness or inability to take PO and Inpatient level of care appropriate due to severity of illness   Dispo: The patient is from: Home              Anticipated d/c is to: Home likely on 4/13              Patient currently is not medically  stable to d/c.   Difficult to place patient No       Consultants:  None   Sch Meds:  Scheduled Meds: . aspirin EC  81 mg Oral Q M,W,F  . enoxaparin (LOVENOX) injection  40 mg Subcutaneous Q24H  . insulin aspart  0-15 Units Subcutaneous Q6H  . metoprolol succinate  25 mg Oral Daily  . prednisoLONE acetate  1 drop Right Eye Daily  . rosuvastatin  5 mg Oral Q M,W,F   Continuous Infusions:  PRN Meds:.acetaminophen **OR** acetaminophen, morphine injection **OR** morphine injection, ondansetron (ZOFRAN) IV, ondansetron **OR** ondansetron (ZOFRAN) IV, polyethylene glycol  Antimicrobials: Anti-infectives (From admission, onward)   None       I have personally reviewed the following labs and images: CBC: Recent Labs  Lab 10/09/20 1919 10/10/20 0421 10/11/20 0346  WBC 13.0* 11.1* 10.4  NEUTROABS  --  7.4  --   HGB 13.3 11.4* 11.0*  HCT 40.0 34.7* 33.7*  MCV 98.5 103.6* 102.4*  PLT 254 194 191   BMP &GFR Recent Labs  Lab 10/09/20 1919 10/10/20 0421 10/11/20 0346  NA 141 141 142  K 4.0 4.0 3.8  CL 104 112* 114*  CO2 27 22 22   GLUCOSE 120* 98 73  BUN 33* 24* 15  CREATININE 1.35* 1.12* 1.06*  CALCIUM 10.2 8.7* 8.7*  MG  --  1.9  --    Estimated Creatinine Clearance: 43.7 mL/min (A) (by C-G formula based on SCr of 1.06 mg/dL (H)). Liver & Pancreas: Recent Labs  Lab 10/09/20 1919 10/10/20 0421 10/11/20 0346  AST 20 23 20   ALT 12 11 10   ALKPHOS 46 41 46  BILITOT 0.3 0.7 0.4  PROT 7.2 5.8* 5.8*  ALBUMIN 4.4 3.2* 3.0*   Recent Labs  Lab 10/09/20 1919 10/10/20 0421 10/11/20 0346 10/12/20 0437  LIPASE 3,862* 1,256* 1,040* 1,554*   No results for input(s): AMMONIA in the last 168 hours. Diabetic: Recent Labs    10/10/20 0421  HGBA1C 6.4*   Recent Labs  Lab 10/11/20 1748 10/11/20 1831 10/11/20 2348 10/12/20 0549 10/12/20 1153  GLUCAP 68* 136* 134* 98 103*   Cardiac Enzymes: No results for input(s): CKTOTAL, CKMB, CKMBINDEX, TROPONINI in  the last 168 hours. No results for input(s): PROBNP in the last 8760 hours. Coagulation Profile: No results for input(s): INR, PROTIME in the last 168 hours. Thyroid Function Tests: No results for input(s): TSH, T4TOTAL, FREET4, T3FREE, THYROIDAB in the last 72 hours. Lipid Profile: Recent Labs    10/10/20 0421  CHOL 149  HDL 48  LDLCALC 78  TRIG 117  CHOLHDL 3.1   Anemia Panel: Recent Labs    10/11/20 0346  VITAMINB12 1,642*  FOLATE 50.7  FERRITIN 65  TIBC 272  IRON 43  RETICCTPCT 1.3   Urine analysis:    Component Value Date/Time   COLORURINE YELLOW 10/09/2020 2014   APPEARANCEUR CLEAR 10/09/2020 2014   LABSPEC 1.017 10/09/2020 2014   PHURINE 7.5 10/09/2020 2014   GLUCOSEU NEGATIVE 10/09/2020 2014   HGBUR NEGATIVE 10/09/2020  2014   BILIRUBINUR NEGATIVE 10/09/2020 2014   KETONESUR NEGATIVE 10/09/2020 2014   PROTEINUR NEGATIVE 10/09/2020 2014   NITRITE NEGATIVE 10/09/2020 2014   LEUKOCYTESUR TRACE (A) 10/09/2020 2014   Sepsis Labs: Invalid input(s): PROCALCITONIN, LACTICIDVEN  Microbiology: Recent Results (from the past 240 hour(s))  Resp Panel by RT-PCR (Flu A&B, Covid) Nasopharyngeal Swab     Status: None   Collection Time: 10/09/20  9:57 PM   Specimen: Nasopharyngeal Swab; Nasopharyngeal(NP) swabs in vial transport medium  Result Value Ref Range Status   SARS Coronavirus 2 by RT PCR NEGATIVE NEGATIVE Final    Comment: (NOTE) SARS-CoV-2 target nucleic acids are NOT DETECTED.  The SARS-CoV-2 RNA is generally detectable in upper respiratory specimens during the acute phase of infection. The lowest concentration of SARS-CoV-2 viral copies this assay can detect is 138 copies/mL. A negative result does not preclude SARS-Cov-2 infection and should not be used as the sole basis for treatment or other patient management decisions. A negative result may occur with  improper specimen collection/handling, submission of specimen other than nasopharyngeal swab,  presence of viral mutation(s) within the areas targeted by this assay, and inadequate number of viral copies(<138 copies/mL). A negative result must be combined with clinical observations, patient history, and epidemiological information. The expected result is Negative.  Fact Sheet for Patients:  BloggerCourse.com  Fact Sheet for Healthcare Providers:  SeriousBroker.it  This test is no t yet approved or cleared by the [place] FDA and  has been authorized for detection and/or diagnosis of SARS-CoV-2 by FDA under an Emergency Use Authorization (EUA). This EUA will remain  in effect (meaning this test can be used) for the duration of the COVID-19 declaration under Section 564(b)(1) of the Act, 21 U.S.C.section 360bbb-3(b)(1), unless the authorization is terminated  or revoked sooner.       Influenza A by PCR NEGATIVE NEGATIVE Final   Influenza B by PCR NEGATIVE NEGATIVE Final    Comment: (NOTE) The Xpert Xpress SARS-CoV-2/FLU/RSV plus assay is intended as an aid in the diagnosis of influenza from Nasopharyngeal swab specimens and should not be used as a sole basis for treatment. Nasal washings and aspirates are unacceptable for Xpert Xpress SARS-CoV-2/FLU/RSV testing.  Fact Sheet for Patients: BloggerCourse.com  Fact Sheet for Healthcare Providers: SeriousBroker.it  This test is not yet approved or cleared by the [place] FDA and has been authorized for detection and/or diagnosis of SARS-CoV-2 by FDA under an Emergency Use Authorization (EUA). This EUA will remain in effect (meaning this test can be used) for the duration of the COVID-19 declaration under Section 564(b)(1) of the Act, 21 U.S.C. section 360bbb-3(b)(1), unless the authorization is terminated or revoked.  Performed at [facility]     Radiology Studies: No results  found.  Admission after midnight.  No charge for this service.   Nester Bachus T. Zamantha Strebel [facility]  If 7PM-7AM, please contact night-coverage [email or web address] 10/12/2020, 4:07 PM

## 2020-10-12 NOTE — Care Management Important Message (Signed)
Important Message  Patient Details IM Letter given to the Patient. Name: Robin Brady MRN: [id number] Date of Birth: 11-22-1945   Medicare Important Message Given:  Yes     Caren Macadam 10/12/2020, 2:47 PM

## 2020-10-13 DIAGNOSIS — E118 Type 2 diabetes mellitus with unspecified complications: Secondary | ICD-10-CM | POA: Diagnosis not present

## 2020-10-13 DIAGNOSIS — R7989 Other specified abnormal findings of blood chemistry: Secondary | ICD-10-CM

## 2020-10-13 DIAGNOSIS — K8500 Idiopathic acute pancreatitis without necrosis or infection: Secondary | ICD-10-CM | POA: Diagnosis not present

## 2020-10-13 DIAGNOSIS — I10 Essential (primary) hypertension: Secondary | ICD-10-CM | POA: Diagnosis not present

## 2020-10-13 DIAGNOSIS — F17210 Nicotine dependence, cigarettes, uncomplicated: Secondary | ICD-10-CM | POA: Diagnosis not present

## 2020-10-13 DIAGNOSIS — N179 Acute kidney failure, unspecified: Secondary | ICD-10-CM

## 2020-10-13 LAB — COMPREHENSIVE METABOLIC PANEL
ALT: 18 U/L (ref 0–44)
AST: 24 U/L (ref 15–41)
Albumin: 3.1 g/dL — ABNORMAL LOW (ref 3.5–5.0)
Alkaline Phosphatase: 49 U/L (ref 38–126)
Anion gap: 7 (ref 5–15)
BUN: 7 mg/dL — ABNORMAL LOW (ref 8–23)
CO2: 21 mmol/L — ABNORMAL LOW (ref 22–32)
Calcium: 8.9 mg/dL (ref 8.9–10.3)
Chloride: 113 mmol/L — ABNORMAL HIGH (ref 98–111)
Creatinine, Ser: 0.85 mg/dL (ref 0.44–1.00)
GFR, Estimated: >60 mL/min (ref >60)
Glucose, Bld: 87 mg/dL (ref 70–99)
Potassium: 3.4 mmol/L — ABNORMAL LOW (ref 3.5–5.1)
Sodium: 141 mmol/L (ref 135–145)
Total Bilirubin: 0.2 mg/dL — ABNORMAL LOW (ref 0.3–1.2)
Total Protein: 5.9 g/dL — ABNORMAL LOW (ref 6.5–8.1)

## 2020-10-13 LAB — CBC
HCT: 33.5 % — ABNORMAL LOW (ref 36.0–46.0)
Hemoglobin: 11.3 g/dL — ABNORMAL LOW (ref 12.0–15.0)
MCH: 33.8 pg (ref 26.0–34.0)
MCHC: 33.7 g/dL (ref 30.0–36.0)
MCV: 100.3 fL — ABNORMAL HIGH (ref 80.0–100.0)
Platelets: 175 10*3/uL (ref 150–400)
RBC: 3.34 MIL/uL — ABNORMAL LOW (ref 3.87–5.11)
RDW: 12.9 % (ref 11.5–15.5)
WBC: 8.3 10*3/uL (ref 4.0–10.5)
nRBC: 0 % (ref 0.0–0.2)

## 2020-10-13 LAB — LIPASE, BLOOD: Lipase: 196 U/L — ABNORMAL HIGH (ref 11–51)

## 2020-10-13 LAB — GLUCOSE, CAPILLARY
Glucose-Capillary: 84 mg/dL (ref 70–99)
Glucose-Capillary: 120 mg/dL — ABNORMAL HIGH (ref 70–99)

## 2020-10-13 LAB — PHOSPHORUS: Phosphorus: 3.2 mg/dL (ref 2.5–4.6)

## 2020-10-13 LAB — MAGNESIUM: Magnesium: 1.8 mg/dL (ref 1.7–2.4)

## 2020-10-13 MED ORDER — POTASSIUM CHLORIDE CRYS ER 20 MEQ PO TBCR
40.0000 meq | EXTENDED_RELEASE_TABLET | ORAL | Status: AC
  Administered 2020-10-13 (×2): 40 meq via ORAL
  Filled 2020-10-13 (×2): qty 2

## 2020-10-13 NOTE — Discharge Summary (Signed)
Physician Discharge Summary  CARLEA BADOUR YTK:[id number] DOB: Nov 04, 1945 DOA: 10/09/2020  PCP: Richmond Campbell., PA-C  Admit date: 10/09/2020 Discharge date: 10/13/2020  Admitted From: Home Disposition: Home  Recommendations for Outpatient Follow-up:  1. Follow ups as below. 2. Please obtain CBC/CMP/Mag/lipase at follow up 3. Please follow up on the following pending results: None  Home Health: None Equipment/Devices: None  Discharge Condition: Stable CODE STATUS: Full code   Follow-up Information    Richmond Campbell., PA-C. Schedule an appointment as soon as possible for a visit in 1 week(s).   Specialty: Family Medicine Contact information: [address] [place] [address] [phone]               Hospital Course: 75 year old F with PMH of NIDDM-2, HTN, HLD, cholecystectomy, pancreatitis, tobacco use disorder and constipation presenting with nausea, vomiting and abdominal pain, and found to have acute uncomplicated pancreatitis with lipase elevated to 3862.  CT abdomen and pelvis showed moderate peripancreatic inflammatory changes consistent with acute pancreatitis without evidence of necrosis or fluid collection.  The cause of her pancreatitis was unclear.  Patient was managed conservatively with IV fluid, analgesics and antiemetics.  Eventually, symptoms resolved.  Lipase trended from 3862 to 196.  She tolerated full liquid diet without issue.  She feels well and ready to go home to advance her diet as tolerated.  See individual problem list below for more on hospital course.  Discharge Diagnoses:  Acute idiopathic and uncomplicated pancreatitis without necrosis or infection-improving.  Lipase 3862>>> 196. LFT within normal.  AKI and GI symptoms resolved.  Triglyceride 117.  Calcium within normal.  Denies heavy alcohol use.  Has cholecystectomy.   Symptoms resolved.  Did not require pain medication in the last 24 to 48 hours. Tolerated full liquid diet.    -Patient to advancing diet at home.  She is quite familiar with this -Recheck CMP and lipase at follow-up.  Controlled NIDDM-2 with hyperlipidemia: A1c 6.4%. No results for input(s): HGBA1C in the last 72 hours. Recent Labs  Lab 10/12/20 1153 10/12/20 1827 10/12/20 2354 10/13/20 0529 10/13/20 1119  GLUCAP 103* 210* 128* 84 120*  -Continue home medications  AKI/azotemia: Thought to have CKD-3A but it could be just AKI which has resolved now. Recent Labs    10/09/20 1919 10/10/20 0421 10/11/20 0346 10/13/20 0416  BUN 33* 24* 15 7*  CREATININE 1.35* 1.12* 1.06* 0.85  -Avoid nephrotoxic meds -Recheck renal function at follow-up  Macrocytic anemia: Hgb dropped about 2 g likely dilutional.  Anemia panel normal. Recent Labs    10/09/20 1919 10/10/20 0421 10/11/20 0346 10/13/20 0416  HGB 13.3 11.4* 11.0* 11.3*  -Consider rechecking CBC at follow-up   Essential hypertension: Normotensive. -Continue home metoprolol  Tobacco use disorder: Reports smoking about 1 to 2 cigarettes a day. -Encouraged tobacco cessation  Leukocytosis: Likely demargination.  Resolved.  Overweight Body mass index is 26.09 kg/m.            Discharge Exam: Vitals:   10/13/20 0527 10/13/20 1242  BP: (!) 141/65 117/64  Pulse: (!) 53 (!) 55  Resp:  18  Temp: 98.3 F (36.8 C) 98.5 F (36.9 C)  SpO2: 98% 97%    GENERAL: No apparent distress.  Nontoxic. HEENT: MMM.  Vision and hearing grossly intact.  NECK: Supple.  No apparent JVD.  RESP: On RA.  No IWOB.  Fair aeration bilaterally. CVS:  RRR. Heart sounds normal.  ABD/GI/GU: Bowel sounds present. Soft. Non tender.  MSK/EXT:  Moves extremities. No apparent deformity. No edema.  SKIN: no apparent skin lesion or wound NEURO: Awake, alert and oriented appropriately.  No apparent focal neuro deficit. PSYCH: Calm. Normal affect.  Discharge Instructions  Discharge Instructions    Call MD for:  persistant nausea and vomiting    Complete by: As directed    Call MD for:  severe uncontrolled pain   Complete by: As directed    Call MD for:  temperature >100.4   Complete by: As directed    Diet - low sodium heart healthy   Complete by: As directed    : Cleared for liquid diet for the next 2 to 3 days and advance to soft diet.   Discharge instructions   Complete by: As directed    It has been a pleasure taking care of you!  You were hospitalized due to acute pancreatitis.  The cause of your pancreatitis is unclear as in most cases but improved.  We recommend quarantining for liquid diet for the next 2 to 3 days and advancing to soft diet as tolerated.  Please follow-up with your primary care doctor in 1 to 2 weeks or sooner if needed   Take care,   Increase activity slowly   Complete by: As directed      Allergies as of 10/13/2020      Reactions   Penicillins Hives, Other (See Comments)   As a child. Tolerates cephalosporins. Has patient had a PCN reaction causing immediate rash, facial/tongue/throat swelling, SOB or lightheadedness with hypotension: No Has patient had a PCN reaction causing severe rash involving mucus membranes or skin necrosis: No Has patient had a PCN reaction that required hospitalization No Has patient had a PCN reaction occurring within the last 10 years: No If all of the above answers are "NO", then may proceed with Cephalosporin use.   Statins Other (See Comments)   Muscle cramps   Sulfa Antibiotics Swelling, Other (See Comments)   angioedema   Tussionex Pennkinetic Er [hydrocod Polst-cpm Polst Er] Other (See Comments)   "thought she was going to die", Cold sweat, Bradycardia, Tolerates hydrocodone   Latex Swelling, Rash, Other (See Comments)   Local swelling      Medication List    TAKE these medications   aspirin EC 81 MG tablet Take 81 mg by mouth every Monday, Wednesday, and Friday.   Calcium Carbonate-Vitamin D 600-400 MG-UNIT tablet Take 1 tablet by mouth daily.    metFORMIN 500 MG 24 hr tablet Commonly known as: GLUCOPHAGE-XR Take 500 mg by mouth daily with breakfast.   metoprolol succinate 25 MG 24 hr tablet Commonly known as: TOPROL-XL Take 25 mg by mouth daily.   metroNIDAZOLE 0.75 % gel Commonly known as: METROGEL Apply 1 application topically daily.   mometasone 0.1 % cream Commonly known as: ELOCON Apply 1 application topically daily as needed (for rash).   MULTIVITAMIN PO Take 1 tablet by mouth daily.   Omega 3 1000 MG Caps Take 3,000 mg by mouth 2 (two) times daily with a meal.   polyethylene glycol 17 g packet Commonly known as: MIRALAX / GLYCOLAX Take 17 g by mouth daily.   Precision QID Test test strip Generic drug: glucose blood Test blood sugars twice a day and as needed   prednisoLONE acetate 1 % ophthalmic suspension Commonly known as: PRED FORTE Place 1 drop into both eyes daily.   rosuvastatin 5 MG tablet Commonly known as: CRESTOR Take 5 mg by mouth every Monday,  Wednesday, Friday, and Saturday at 6 PM. M/w/f/s   vitamin B-12 1000 MCG tablet Commonly known as: CYANOCOBALAMIN Take 1,000 mcg by mouth daily. M/w/f/s   Vitamin D 50 MCG (2000 UT) Caps Take 2,000 Units by mouth daily.       Consultations:  None  Procedures/Studies:   CT ABDOMEN PELVIS W CONTRAST  Result Date: 10/09/2020 CLINICAL DATA:  Upper abdominal pain with vomiting EXAM: CT ABDOMEN AND PELVIS WITH CONTRAST TECHNIQUE: Multidetector CT imaging of the abdomen and pelvis was performed using the standard protocol following bolus administration of intravenous contrast. CONTRAST:  35mL OMNIPAQUE IOHEXOL 300 MG/ML  SOLN COMPARISON:  MRI 06/19/2017, CT 04/05/2017 FINDINGS: Lower chest: Lung bases demonstrate streaky atelectasis or scarring. Mild lower lobe bronchiectasis. No pleural effusion. Normal cardiac size. Hepatobiliary: No focal liver abnormality is seen. Status post cholecystectomy. No biliary dilatation. Pancreas: Moderate  peripancreatic inflammatory change consistent with acute pancreatitis. Homogeneous enhancement and no evidence for necrosis at this time. Isolated calcification at the pancreatic head as before. Spleen: Normal in size without focal abnormality. Adrenals/Urinary Tract: Adrenal glands are unremarkable. Kidneys show no hydronephrosis. Small nonobstructing stone lower pole left kidney. Stomach/Bowel: Stomach is within normal limits. Appendix appears normal. No evidence of bowel wall thickening, distention, or inflammatory changes. Vascular/Lymphatic: Moderate aortic atherosclerosis. No aneurysm. Patency of the portal and splenic veins. No suspicious adenopathy Reproductive: Uterus and bilateral adnexa are unremarkable. Other: No free air. Small fluid in the right anterior pararenal space. Musculoskeletal: Chronic superior endplate deformity at L1. IMPRESSION: 1. Moderate peripancreatic inflammatory change consistent with acute pancreatitis. No evidence for necrosis or organized fluid collection at this time. 2. Nonobstructing stone in the left kidney. Aortic Atherosclerosis (ICD10-I70.0). Electronically Signed   By: Jasmine Pang M.D.   On: 10/09/2020 21:55        The results of significant diagnostics from this hospitalization (including imaging, microbiology, ancillary and laboratory) are listed below for reference.     Microbiology: Recent Results (from the past 240 hour(s))  Resp Panel by RT-PCR (Flu A&B, Covid) Nasopharyngeal Swab     Status: None   Collection Time: 10/09/20  9:57 PM   Specimen: Nasopharyngeal Swab; Nasopharyngeal(NP) swabs in vial transport medium  Result Value Ref Range Status   SARS Coronavirus 2 by RT PCR NEGATIVE NEGATIVE Final    Comment: (NOTE) SARS-CoV-2 target nucleic acids are NOT DETECTED.  The SARS-CoV-2 RNA is generally detectable in upper respiratory specimens during the acute phase of infection. The lowest concentration of SARS-CoV-2 viral copies this assay can  detect is 138 copies/mL. A negative result does not preclude SARS-Cov-2 infection and should not be used as the sole basis for treatment or other patient management decisions. A negative result may occur with  improper specimen collection/handling, submission of specimen other than nasopharyngeal swab, presence of viral mutation(s) within the areas targeted by this assay, and inadequate number of viral copies(<138 copies/mL). A negative result must be combined with clinical observations, patient history, and epidemiological information. The expected result is Negative.  Fact Sheet for Patients:  BloggerCourse.com  Fact Sheet for Healthcare Providers:  SeriousBroker.it  This test is no t yet approved or cleared by the [place] FDA and  has been authorized for detection and/or diagnosis of SARS-CoV-2 by FDA under an Emergency Use Authorization (EUA). This EUA will remain  in effect (meaning this test can be used) for the duration of the COVID-19 declaration under Section 564(b)(1) of the Act, 21 U.S.C.section 360bbb-3(b)(1), unless the authorization is terminated  or revoked sooner.       Influenza A by PCR NEGATIVE NEGATIVE Final   Influenza B by PCR NEGATIVE NEGATIVE Final    Comment: (NOTE) The Xpert Xpress SARS-CoV-2/FLU/RSV plus assay is intended as an aid in the diagnosis of influenza from Nasopharyngeal swab specimens and should not be used as a sole basis for treatment. Nasal washings and aspirates are unacceptable for Xpert Xpress SARS-CoV-2/FLU/RSV testing.  Fact Sheet for Patients: BloggerCourse.com  Fact Sheet for Healthcare Providers: SeriousBroker.it  This test is not yet approved or cleared by the [place] FDA and has been authorized for detection and/or diagnosis of SARS-CoV-2 by FDA under an Emergency Use Authorization (EUA). This EUA will remain in  effect (meaning this test can be used) for the duration of the COVID-19 declaration under Section 564(b)(1) of the Act, 21 U.S.C. section 360bbb-3(b)(1), unless the authorization is terminated or revoked.  Performed at [facility]      Labs:  CBC: Recent Labs  Lab 10/09/20 1919 10/10/20 0421 10/11/20 0346 10/13/20 0416  WBC 13.0* 11.1* 10.4 8.3  NEUTROABS  --  7.4  --   --   HGB 13.3 11.4* 11.0* 11.3*  HCT 40.0 34.7* 33.7* 33.5*  MCV 98.5 103.6* 102.4* 100.3*  PLT 254 194 191 175   BMP &GFR Recent Labs  Lab 10/09/20 1919 10/10/20 0421 10/11/20 0346 10/13/20 0416  NA 141 141 142 141  K 4.0 4.0 3.8 3.4*  CL 104 112* 114* 113*  CO2 27 22 22  21*  GLUCOSE 120* 98 73 87  BUN 33* 24* 15 7*  CREATININE 1.35* 1.12* 1.06* 0.85  CALCIUM 10.2 8.7* 8.7* 8.9  MG  --  1.9  --  1.8  PHOS  --   --   --  3.2   Estimated Creatinine Clearance: 54.5 mL/min (by C-G formula based on SCr of 0.85 mg/dL). Liver & Pancreas: Recent Labs  Lab 10/09/20 1919 10/10/20 0421 10/11/20 0346 10/13/20 0416  AST 20 23 20 24   ALT 12 11 10 18   ALKPHOS 46 41 46 49  BILITOT 0.3 0.7 0.4 0.2*  PROT 7.2 5.8* 5.8* 5.9*  ALBUMIN 4.4 3.2* 3.0* 3.1*   Recent Labs  Lab 10/09/20 1919 10/10/20 0421 10/11/20 0346 10/12/20 0437 10/13/20 0416  LIPASE 3,862* 1,256* 1,040* 1,554* 196*   No results for input(s): AMMONIA in the last 168 hours. Diabetic: No results for input(s): HGBA1C in the last 72 hours. Recent Labs  Lab 10/12/20 1153 10/12/20 1827 10/12/20 2354 10/13/20 0529 10/13/20 1119  GLUCAP 103* 210* 128* 84 120*   Cardiac Enzymes: No results for input(s): CKTOTAL, CKMB, CKMBINDEX, TROPONINI in the last 168 hours. No results for input(s): PROBNP in the last 8760 hours. Coagulation Profile: No results for input(s): INR, PROTIME in the last 168 hours. Thyroid Function Tests: No results for input(s): TSH, T4TOTAL, FREET4, T3FREE, THYROIDAB in the last 72 hours. Lipid  Profile: No results for input(s): CHOL, HDL, LDLCALC, TRIG, CHOLHDL, LDLDIRECT in the last 72 hours. Anemia Panel: Recent Labs    10/11/20 0346  VITAMINB12 1,642*  FOLATE 50.7  FERRITIN 65  TIBC 272  IRON 43  RETICCTPCT 1.3   Urine analysis:    Component Value Date/Time   COLORURINE YELLOW 10/09/2020 2014   APPEARANCEUR CLEAR 10/09/2020 2014   LABSPEC 1.017 10/09/2020 2014   PHURINE 7.5 10/09/2020 2014   GLUCOSEU NEGATIVE 10/09/2020 2014   HGBUR NEGATIVE 10/09/2020 2014   BILIRUBINUR NEGATIVE 10/09/2020 2014   KETONESUR  NEGATIVE 10/09/2020 2014   PROTEINUR NEGATIVE 10/09/2020 2014   NITRITE NEGATIVE 10/09/2020 2014   LEUKOCYTESUR TRACE (A) 10/09/2020 2014   Sepsis Labs: Invalid input(s): PROCALCITONIN, LACTICIDVEN   Time coordinating discharge: 35 minutes  SIGNED:  Almon Herculesaye T Solace Manwarren, MD  [facility] 10/13/2020, 5:33 PM  If 7PM-7AM, please contact night-coverage [email or web address]

## 2020-10-13 NOTE — Progress Notes (Signed)
Assessment unchanged. Pt verbalized understanding of dc instructions through teach back. Discharged via foot accompanied by husband to front entrance.

## 2020-11-23 ENCOUNTER — Other Ambulatory Visit: Payer: Self-pay

## 2020-11-23 ENCOUNTER — Ambulatory Visit: Payer: Medicare Other | Admitting: Pulmonary Disease

## 2020-11-23 ENCOUNTER — Encounter: Payer: Self-pay | Admitting: Pulmonary Disease

## 2020-11-23 VITALS — BP 118/74 | HR 69 | Temp 97.8°F | Ht 63.5 in | Wt 153.8 lb

## 2020-11-23 DIAGNOSIS — R9389 Abnormal findings on diagnostic imaging of other specified body structures: Secondary | ICD-10-CM

## 2020-11-23 DIAGNOSIS — L905 Scar conditions and fibrosis of skin: Secondary | ICD-10-CM

## 2020-11-23 NOTE — Progress Notes (Signed)
Robin Brady    [id number]    May 03, 1946  Primary Care Physician:Kaplan, Baldemar Friday., PA-C  Referring Physician: Aletha Halim., PA-C [address]  [place] [address]  Chief complaint:   She is being seen for an abnormal chest x-ray showing what was described as emphysema  HPI:  She does have an occasional cough Brings up thick stringy sputum sometimes  Not limited with activities  Can function well without significant limitations  Medical history of diabetes-well-controlled, hypertension, hyperlipidemia Pancreatitis  Does not recollect having any history of severe pneumonia in the past  We did look up old CT scan of the abdomen, 1 performed recently when she was being evaluated for pancreatitis that shows thickened interstitium, previous CT abdomen from a couple years ago and revealed bilateral basal haziness  She smokes about 1-2 sticks of cigarettes a day Was never a heavy smoker  No weight loss Has had multiple corneal transplants  Outpatient Encounter Medications as of 11/23/2020  Medication Sig  . aspirin EC 81 MG tablet Take 81 mg by mouth every Monday, Wednesday, and Friday.  . Calcium Carbonate-Vitamin D 600-400 MG-UNIT tablet Take 1 tablet by mouth daily.  . Cholecalciferol (VITAMIN D) 2000 UNITS CAPS Take 2,000 Units by mouth daily.   Marland Kitchen glucose blood (PRECISION QID TEST) test strip Test blood sugars twice a day and as needed  . metFORMIN (GLUCOPHAGE-XR) 500 MG 24 hr tablet Take 500 mg by mouth daily with breakfast.  . metoprolol succinate (TOPROL-XL) 25 MG 24 hr tablet Take 25 mg by mouth daily.  . metroNIDAZOLE (METROGEL) 0.75 % gel Apply 1 application topically daily.  . mometasone (ELOCON) 0.1 % cream Apply 1 application topically daily as needed (for rash).   . Multiple Vitamins-Minerals (MULTIVITAMIN PO) Take 1 tablet by mouth daily.  . Omega 3 1000 MG CAPS Take 3,000 mg by mouth 2 (two) times daily with a meal.   . polyethylene  glycol (MIRALAX / GLYCOLAX) packet Take 17 g by mouth daily.  . prednisoLONE acetate (PRED FORTE) 1 % ophthalmic suspension Place 1 drop into both eyes daily.  . rosuvastatin (CRESTOR) 5 MG tablet Take 5 mg by mouth every Monday, Wednesday, Friday, and Saturday at 6 PM. M/w/f/s  . vitamin B-12 (CYANOCOBALAMIN) 1000 MCG tablet Take 1,000 mcg by mouth daily. M/w/f/s   No facility-administered encounter medications on file as of 11/23/2020.    Allergies as of 11/23/2020 - Review Complete 11/23/2020  Allergen Reaction Noted  . Penicillins Hives and Other (See Comments) 07/23/2013  . Statins Other (See Comments) 07/18/2016  . Sulfa antibiotics Swelling and Other (See Comments) 07/23/2013  . Tussionex pennkinetic er [hydrocod polst-cpm polst er] Other (See Comments) 07/23/2013  . Other Other (See Comments) 04/08/2020  . Latex Swelling, Rash, and Other (See Comments) 07/18/2016    Past Medical History:  Diagnosis Date  . Anemia   . Arthritis    OA  . Constipation   . Diabetes ([facility])   . Diabetes mellitus without complication ([facility])   . Dysrhythmia    HX PALPITATIONS   . Hyperlipemia   . Hypertension   . Loss of consciousness ([facility])   . Pancreatitis   . Ptosis   . Thyroid nodule    8-10 YRS  NO CHANGE    Past Surgical History:  Procedure Laterality Date  . BREAST CYSTECTOMY Bilateral 3335   w/ Silicone implants  . BREAST IMPLANT REMOVAL Bilateral 1989  Replaced w/ saline implants  . CARDIOVASCULAR STRESS TEST  09/30/2008   No evidence of signifcant ischemia.   Marland Kitchen CAROTID ANGIOGRAM  06/03/2007   No evidence to suggest occlusion, stenosis, dissection, aneurysm, or dural AV fistulas on images provided. ophthalmic artierieshave normal originsw/ normal capillary blush.  Marland Kitchen CATARACT EXTRACTION Bilateral 2006  . CHOLECYSTECTOMY N/A 08/28/2017   Procedure: LAPAROSCOPIC CHOLECYSTECTOMY WITH INTRAOPERATIVE CHOLANGIOGRAM;  Surgeon: Coralie Keens, MD;  Location: [facility];  Service: General;   Laterality: N/A;  . CORNEAL TRANSPLANT Right 1960, 1986, 1991, & 2004  . PARS PLANA VITRECTOMY W/ REPAIR OF MACULAR HOLE  2009  . PILONIDAL CYST EXCISION  1964  . TONSILLECTOMY  1948    Family History  Problem Relation Age of Onset  . Multiple myeloma Mother   . Heart failure Father   . Diabetes Father   . Heart attack Father 68  . Diabetes Brother 12  . Hypertension Brother 8  . Diabetes Paternal Grandmother   . Crohn's disease Brother 78  . Diabetes Maternal Grandfather   . Diabetes Brother 25  . Alcoholism Brother   . 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    Social History   Socioeconomic History  . Marital status: Married    Spouse name: willie  . Number of children: 2  . Years of education: college  . Highest education level: Not on file  Occupational History  . Occupation: Administrator, [employer] Service: [employer]  Tobacco Use  . Smoking status: Current Every Day Smoker    Packs/day: 0.25  . Smokeless tobacco: Never Used  Vaping Use  . Vaping Use: Never used  Substance and Sexual Activity  . Alcohol use: No  . Drug use: No  . Sexual activity: Not on file  Other Topics Concern  . Not on file  Social History Narrative  . Not on file   Social Determinants of Health   Financial Resource Strain: Not on file  Food Insecurity: Not on file  Transportation Needs: Not on file  Physical Activity: Not on file  Stress: Not on file  Social Connections: Not on file  Intimate Partner Violence: Not on file    Review of Systems  Constitutional: Negative for activity change.  HENT: Negative.   Respiratory: Positive for cough. Negative for shortness of breath.   Gastrointestinal: Negative.   Psychiatric/Behavioral: Negative for sleep disturbance.    Vitals:   11/23/20 1059  BP: 118/74  Pulse: 69  Temp: 97.8 F (36.6 C)  SpO2: 97%     Physical Exam Constitutional:       Appearance: She is obese.  HENT:     Head: Normocephalic.     Nose: No congestion or rhinorrhea.     Mouth/Throat:     Mouth: Mucous membranes are moist.     Pharynx: No oropharyngeal exudate.  Eyes:     General:        Right eye: No discharge.        Left eye: No discharge.  Cardiovascular:     Rate and Rhythm: Normal rate and regular rhythm.     Heart sounds: No murmur heard.   Pulmonary:     Effort: No respiratory distress.     Breath sounds: No stridor. No wheezing or rhonchi.  Musculoskeletal:     Cervical back: No rigidity or tenderness.  Neurological:     Mental Status: She  is alert.  Psychiatric:        Mood and Affect: Mood normal.    Data Reviewed: Abdominal CT 10/09/2020 reviewed showing thickened interstitial Previous abdominal CT from 2018 shows multifocal infiltrates at bases bilaterally  Assessment:  Cough Shortness of breath  Abnormal chest x-ray with concern for emphysema  Active smoker  Patient is not limited with activities of daily living  Cough is call cough is chronic, nonlimiting  Plan/Recommendations: Will obtain high-resolution CT scan of the chest to further evaluate the bibasal scarring  Obtain a pulmonary function test  Smoking cessation counseling  I will see her back in about 6 to 8 weeks  Encouraged to call with any significant concerns   Sherrilyn Rist MD [facility] 11/23/2020, 11:28 AM  CC: Aletha Halim., PA-C

## 2020-11-23 NOTE — Patient Instructions (Signed)
Obtain CT scan of the chest-high-resolution CT to evaluate scarring at the bases of the lungs  Obtain breathing study  Continue to work on quitting smoking  I will see you back in 6 to 8 weeks

## 2020-12-09 ENCOUNTER — Ambulatory Visit
Admission: RE | Admit: 2020-12-09 | Discharge: 2020-12-09 | Disposition: A | Discharge status: Home / Self Care | Payer: Medicare Other | Source: Ambulatory Visit | Attending: Pulmonary Disease | Admitting: Pulmonary Disease

## 2020-12-09 ENCOUNTER — Other Ambulatory Visit: Payer: Self-pay

## 2020-12-09 DIAGNOSIS — R9389 Abnormal findings on diagnostic imaging of other specified body structures: Secondary | ICD-10-CM

## 2020-12-09 DIAGNOSIS — L905 Scar conditions and fibrosis of skin: Secondary | ICD-10-CM

## 2021-01-23 ENCOUNTER — Other Ambulatory Visit (HOSPITAL_COMMUNITY)
Admission: RE | Admit: 2021-01-23 | Discharge: 2021-01-23 | Disposition: A | Discharge status: Home / Self Care | Payer: Medicare Other | Source: Ambulatory Visit | Attending: Pulmonary Disease | Admitting: Pulmonary Disease

## 2021-01-23 DIAGNOSIS — Z20822 Contact with and (suspected) exposure to covid-19: Secondary | ICD-10-CM | POA: Diagnosis not present

## 2021-01-23 DIAGNOSIS — Z01812 Encounter for preprocedural laboratory examination: Secondary | ICD-10-CM | POA: Insufficient documentation

## 2021-01-23 LAB — SARS CORONAVIRUS 2 (TAT 6-24 HRS): SARS Coronavirus 2: NEGATIVE

## 2021-01-26 ENCOUNTER — Encounter: Payer: Self-pay | Admitting: Pulmonary Disease

## 2021-01-26 ENCOUNTER — Ambulatory Visit: Payer: Medicare Other | Admitting: Pulmonary Disease

## 2021-01-26 ENCOUNTER — Other Ambulatory Visit: Payer: Self-pay

## 2021-01-26 ENCOUNTER — Ambulatory Visit (INDEPENDENT_AMBULATORY_CARE_PROVIDER_SITE_OTHER): Payer: Medicare Other | Admitting: Pulmonary Disease

## 2021-01-26 VITALS — BP 118/64 | HR 64 | Temp 97.6°F | Ht 63.5 in | Wt 155.0 lb

## 2021-01-26 DIAGNOSIS — R9389 Abnormal findings on diagnostic imaging of other specified body structures: Secondary | ICD-10-CM

## 2021-01-26 DIAGNOSIS — R0602 Shortness of breath: Secondary | ICD-10-CM

## 2021-01-26 DIAGNOSIS — L905 Scar conditions and fibrosis of skin: Secondary | ICD-10-CM

## 2021-01-26 LAB — PULMONARY FUNCTION TEST
DL/VA % pred: 82 %
DL/VA: 3.39 ml/min/mmHg/L
DLCO cor % pred: 80 %
DLCO cor: 15.08 ml/min/mmHg
DLCO unc % pred: 80 %
DLCO unc: 15.08 ml/min/mmHg
FEF 25-75 Post: 2.01 L/sec
FEF 25-75 Pre: 1.83 L/sec
FEF2575-%Change-Post: 9 %
FEF2575-%Pred-Post: 126 %
FEF2575-%Pred-Pre: 115 %
FEV1-%Change-Post: 2 %
FEV1-%Pred-Post: 99 %
FEV1-%Pred-Pre: 97 %
FEV1-Post: 2.02 L
FEV1-Pre: 1.97 L
FEV1FVC-%Change-Post: -3 %
FEV1FVC-%Pred-Pre: 103 %
FEV6-%Change-Post: 5 %
FEV6-%Pred-Post: 104 %
FEV6-%Pred-Pre: 98 %
FEV6-Post: 2.69 L
FEV6-Pre: 2.54 L
FEV6FVC-%Pred-Post: 105 %
FEV6FVC-%Pred-Pre: 105 %
FVC-%Change-Post: 5 %
FVC-%Pred-Post: 99 %
FVC-%Pred-Pre: 93 %
FVC-Post: 2.69 L
FVC-Pre: 2.54 L
Post FEV1/FVC ratio: 75 %
Post FEV6/FVC ratio: 100 %
Pre FEV1/FVC ratio: 77 %
Pre FEV6/FVC Ratio: 100 %
RV % pred: 113 %
RV: 2.59 L
TLC % pred: 106 %
TLC: 5.29 L

## 2021-01-26 NOTE — Patient Instructions (Addendum)
History of cough and shortness of breath with improving symptoms with decreasing cigarette use  Abnormal CT scan of attest with interstitial infiltrates  Normal pulmonary function test  Repeat CT scan of the chest in 1 year Repeat pulmonary function test in 1 year  Call with significant concerns  Shortness of breath/cough

## 2021-01-26 NOTE — Patient Instructions (Signed)
Full PFT performed today. °

## 2021-01-26 NOTE — Progress Notes (Signed)
Full PFT performed today. °

## 2021-01-26 NOTE — Progress Notes (Signed)
RIOT BARRICK    [id number]    04-19-1946  Primary Care Physician:Kaplan, Baldemar Friday., PA-C  Referring Physician: Aletha Halim., PA-C [address],  [place] [address]  Chief complaint:   In for follow-up for abnormal chest x-ray with concern for emphysema  HPI:  Occasional cough Sputum production Symptoms are a lot better  She has cut down on smoking  Still has occasional flank Not limited with activities of daily living  Denies any chest pains or chest discomfort  Medical history of diabetes-well-controlled, hypertension, hyperlipidemia Pancreatitis  Does not recollect having any history of severe pneumonia in the past  We did look up old CT scan of the abdomen, 1 performed recently when she was being evaluated for pancreatitis that shows thickened interstitium, previous CT abdomen from a couple years ago and revealed bilateral basal haziness  She smokes about 1-2 sticks of cigarettes a day Was never a heavy smoker  Worked as a Marine scientist  No weight loss Has had multiple corneal transplants  Outpatient Encounter Medications as of 01/26/2021  Medication Sig   aspirin EC 81 MG tablet Take 81 mg by mouth every Monday, Wednesday, and Friday.   Calcium Carbonate-Vitamin D 600-400 MG-UNIT tablet Take 1 tablet by mouth daily.   Cholecalciferol (VITAMIN D) 2000 UNITS CAPS Take 2,000 Units by mouth daily.    glucose blood (PRECISION QID TEST) test strip Test blood sugars twice a day and as needed   metFORMIN (GLUCOPHAGE-XR) 500 MG 24 hr tablet Take 500 mg by mouth daily with breakfast.   metoprolol succinate (TOPROL-XL) 25 MG 24 hr tablet Take 25 mg by mouth daily.   metroNIDAZOLE (METROGEL) 0.75 % gel Apply 1 application topically daily.   mometasone (ELOCON) 0.1 % cream Apply 1 application topically daily as needed (for rash).    Multiple Vitamins-Minerals (MULTIVITAMIN PO) Take 1 tablet by mouth daily.   Omega 3 1000 MG CAPS Take 3,000 mg by mouth 2  (two) times daily with a meal.    polyethylene glycol (MIRALAX / GLYCOLAX) packet Take 17 g by mouth daily.   prednisoLONE acetate (PRED FORTE) 1 % ophthalmic suspension Place 1 drop into both eyes daily.   rosuvastatin (CRESTOR) 5 MG tablet Take 5 mg by mouth every Monday, Wednesday, Friday, and Saturday at 6 PM. M/w/f/s   vitamin B-12 (CYANOCOBALAMIN) 1000 MCG tablet Take 1,000 mcg by mouth daily. M/w/f/s   No facility-administered encounter medications on file as of 01/26/2021.    Allergies as of 01/26/2021 - Review Complete 01/26/2021  Allergen Reaction Noted   Penicillins Hives and Other (See Comments) 07/23/2013   Statins Other (See Comments) 07/18/2016   Sulfa antibiotics Swelling and Other (See Comments) 07/23/2013   Tussionex pennkinetic er [hydrocod polst-cpm polst er] Other (See Comments) 07/23/2013   Other Other (See Comments) 04/08/2020   Latex Swelling, Rash, and Other (See Comments) 07/18/2016    Past Medical History:  Diagnosis Date   Anemia    Arthritis    OA   Constipation    Diabetes ([facility])    Diabetes mellitus without complication ([facility])    Dysrhythmia    HX PALPITATIONS    Hyperlipemia    Hypertension    Loss of consciousness ([facility])    Pancreatitis    Ptosis    Thyroid nodule    8-10 YRS  NO CHANGE    Past Surgical History:  Procedure Laterality Date   BREAST CYSTECTOMY Bilateral 1973  w/ Silicone implants   BREAST IMPLANT REMOVAL Bilateral 1989   Replaced w/ saline implants   CARDIOVASCULAR STRESS TEST  09/30/2008   No evidence of signifcant ischemia.    CAROTID ANGIOGRAM  06/03/2007   No evidence to suggest occlusion, stenosis, dissection, aneurysm, or dural AV fistulas on images provided. ophthalmic artierieshave normal originsw/ normal capillary blush.   CATARACT EXTRACTION Bilateral 2006   CHOLECYSTECTOMY N/A 08/28/2017   Procedure: LAPAROSCOPIC CHOLECYSTECTOMY WITH INTRAOPERATIVE CHOLANGIOGRAM;  Surgeon: Coralie Keens, MD;  Location: [facility];   Service: General;  Laterality: N/A;   CORNEAL TRANSPLANT Right 1960, 1986, 1991, & 2004   PARS PLANA VITRECTOMY W/ REPAIR OF MACULAR HOLE  2009   PILONIDAL CYST [place]    Family History  Problem Relation Age of Onset   Multiple myeloma Mother    Heart failure Father    Diabetes Father    Heart attack Father 71   Diabetes Brother 107   Hypertension Brother 67   Diabetes Paternal Grandmother    Crohn's disease Brother 24   Diabetes Maternal Grandfather    Diabetes Brother 36   Alcoholism Brother    Ulcers Brother        Foot ulcers - developed MRSA following, 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    Social History   Socioeconomic History   Marital status: Married    Spouse name: willie   Number of children: 2   Years of education: college   Highest education level: Not on file  Occupational History   Occupation: [employer]    Employer: [employer]  Tobacco Use   Smoking status: Some Days    Packs/day: 0.25    Types: Cigarettes    Start date: 07/02/1990   Smokeless tobacco: Never   Tobacco comments:    Patient takes 1 puff and that's all. 01/26/21.   Vaping Use   Vaping Use: Never used  Substance and Sexual Activity   Alcohol use: No   Drug use: No   Sexual activity: Not on file  Other Topics Concern   Not on file  Social History Narrative   Not on file   Social Determinants of Health   Financial Resource Strain: Not on file  Food Insecurity: Not on file  Transportation Needs: Not on file  Physical Activity: Not on file  Stress: Not on file  Social Connections: Not on file  Intimate Partner Violence: Not on file    Review of Systems  Constitutional:  Negative for activity change.  HENT: Negative.    Respiratory:  Positive for cough. Negative for shortness of breath.   Gastrointestinal: Negative.   Psychiatric/Behavioral:  Negative for sleep disturbance.    Vitals:   01/26/21 1205  BP: 118/64  Pulse: 64   Temp: 97.6 F (36.4 C)  SpO2: 98%     Physical Exam HENT:     Head: Normocephalic.     Nose: No congestion or rhinorrhea.     Mouth/Throat:     Mouth: Mucous membranes are moist.     Pharynx: No oropharyngeal exudate.  Eyes:     General:        Right eye: No discharge.        Left eye: No discharge.  Cardiovascular:     Rate and Rhythm: Normal rate and regular rhythm.     Heart sounds: No murmur heard. Pulmonary:     Effort: No respiratory distress.     Breath sounds:  No stridor. No wheezing or rhonchi.  Musculoskeletal:     Cervical back: No rigidity or tenderness.  Neurological:     Mental Status: She is alert.  Psychiatric:        Mood and Affect: Mood normal.   Data Reviewed: Abdominal CT 10/09/2020 reviewed showing thickened interstitial Previous abdominal CT from 2018 shows multifocal infiltrates at bases bilaterally  CT scan with interstitial changes.  High-resolution CT showing lower lobe predominant interstitial process.  Similar process was present on CT from 2018-actually shows some improvement PFT reviewed with the patient was within normal limits  Assessment:   Cough, shortness of breath  Abnormal CT scan of the chest  Has significantly cut down on smoking  Cough is not limiting  Plan/Recommendations: Will obtain high-resolution CT scan repeat in a year  Obtain a pulmonary function test repeat in a year  Smoking cessation counseling  Follow-up a year from now  Encouraged to call with any significant concerns   Sherrilyn Rist MD [facility] 01/26/2021, 12:30 PM  CC: Aletha Halim., PA-C

## 2021-03-30 ENCOUNTER — Encounter: Payer: Self-pay | Admitting: Podiatry

## 2021-03-30 ENCOUNTER — Other Ambulatory Visit: Payer: Self-pay

## 2021-03-30 ENCOUNTER — Ambulatory Visit: Payer: Medicare Other | Admitting: Podiatry

## 2021-03-30 DIAGNOSIS — L600 Ingrowing nail: Secondary | ICD-10-CM | POA: Diagnosis not present

## 2021-03-30 MED ORDER — CEPHALEXIN 500 MG PO CAPS: 500.0000 mg | ORAL_CAPSULE | Freq: Four times a day (QID) | ORAL | 0 refills | Status: DC

## 2021-03-30 NOTE — Progress Notes (Signed)
  Subjective:  Patient ID: Robin Brady, female    DOB: 1945-10-10,   MRN: [id number]  Chief Complaint  Patient presents with   Nail Problem    Patient states that she had an ingrown nail removed from the medial border of the left hallux and it never grew back the right way and it's tender to touch. Patient is concerned because she is diabetic and wants to make sure everything is okay.     75 y.o. female presents for left ingrown nail of medial border of hallux that has been present for several years. Previously had it removed by Dr. Charlsie Merles and states it has come back and been more tender recently. Denies purulence or drainage . Denies any other pedal complaints. Denies n/v/f/c.   Past Medical History:  Diagnosis Date   Anemia    Arthritis    OA   Constipation    Diabetes ([facility])    Diabetes mellitus without complication ([facility])    Dysrhythmia    HX PALPITATIONS    Hyperlipemia    Hypertension    Loss of consciousness ([facility])    Pancreatitis    Ptosis    Thyroid nodule    8-10 YRS  NO CHANGE    Objective:  Physical Exam: Vascular: DP/PT pulses 2/4 bilateral. CFT <3 seconds. Normal hair growth on digits. No edema.  Skin. No lacerations or abrasions bilateral feet. Left hallux medial border incurvated.  Musculoskeletal: MMT 5/5 bilateral lower extremities in DF, PF, Inversion and Eversion. Deceased ROM in DF of ankle joint.  Neurological: Sensation intact to light touch.   Assessment:   1. Ingrown nail of great toe of left foot      Plan:  Patient was evaluated and treated and all questions answered. Patient requesting ingrown nail procedure. Note below.  Discussed procedures and post-operative care.  Prescription for keflex provided.  Patient to return in 2 weeks for follow-up   Procedure:  Procedure: partial Nail Avulsion of left hallux medial nail border.  Surgeon: Louann Sjogren, DPM  Pre-op Dx: Ingrown toenail without infection Post-op: Same  Place of Surgery:  Office exam room.  Indications for surgery: Painful and ingrown toenail.  Findings: Incurvation of left hallux nail medial border.    The patient is requesting removal of nail with chemical matrixectomy. Risks and complications were discussed with the patient for which they understand and written consent was obtained. Under sterile conditions a total of 3 mL  1% lidocaine plain was infiltrated in a hallux block fashion. Once anesthetized, the skin was prepped in sterile fashion. A tourniquet was then applied. Next the left medial aspect of hallux nail border was then sharply excised making sure to remove the entire offending nail border.  Next phenol was then applied under standard conditions and copiously irrigated. Silvadene was applied. A dry sterile dressing was applied. After application of the dressing the tourniquet was removed and there is found to be an immediate capillary refill time to the digit. The patient tolerated the procedure well without any complications. Post procedure instructions were discussed the patient for which he verbally understood. Follow-up in two weeks for nail check or sooner if any problems are to arise. Discussed signs/symptoms of infection and directed to call the office immediately should any occur or go directly to the emergency room. In the meantime, encouraged to call the office with any questions, concerns, changes symptoms.   Louann Sjogren, DPM

## 2021-03-30 NOTE — Patient Instructions (Signed)

## 2021-04-11 ENCOUNTER — Ambulatory Visit: Payer: Medicare Other | Admitting: Podiatry

## 2021-08-15 ENCOUNTER — Encounter: Payer: Self-pay | Admitting: Cardiovascular Disease

## 2021-08-15 ENCOUNTER — Other Ambulatory Visit: Payer: Self-pay

## 2021-08-15 ENCOUNTER — Ambulatory Visit: Payer: Medicare Other | Admitting: Cardiovascular Disease

## 2021-08-15 DIAGNOSIS — I10 Essential (primary) hypertension: Secondary | ICD-10-CM | POA: Diagnosis not present

## 2021-08-15 DIAGNOSIS — R002 Palpitations: Secondary | ICD-10-CM | POA: Diagnosis not present

## 2021-08-15 DIAGNOSIS — E782 Mixed hyperlipidemia: Secondary | ICD-10-CM | POA: Diagnosis not present

## 2021-08-15 NOTE — Assessment & Plan Note (Signed)
History of essential hypertension a blood pressure measured today 124/66.  She is on metoprolol.

## 2021-08-15 NOTE — Assessment & Plan Note (Signed)
History of palpitations in the past which is no longer an issue.  She is on a beta-blocker.

## 2021-08-15 NOTE — Patient Instructions (Signed)
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
## 2021-08-15 NOTE — Assessment & Plan Note (Signed)
History of hyperlipidemia on rosuvastatin 4 times a week with lipid profile performed 10/10/2020 revealing total cholesterol 149, LDL 78 and HDL 48.

## 2022-01-09 ENCOUNTER — Ambulatory Visit
Admission: RE | Admit: 2022-01-09 | Discharge: 2022-01-09 | Disposition: A | Discharge status: Home / Self Care | Payer: Medicare Other | Source: Ambulatory Visit | Attending: Pulmonary Disease | Admitting: Pulmonary Disease

## 2022-01-09 DIAGNOSIS — R0602 Shortness of breath: Secondary | ICD-10-CM

## 2022-01-15 ENCOUNTER — Encounter: Payer: Self-pay | Admitting: Pulmonary Disease

## 2022-01-15 ENCOUNTER — Ambulatory Visit: Payer: Medicare Other | Admitting: Pulmonary Disease

## 2022-01-15 VITALS — BP 110/64 | HR 61 | Temp 98.8°F | Ht 63.5 in | Wt 157.6 lb

## 2022-01-15 DIAGNOSIS — R9389 Abnormal findings on diagnostic imaging of other specified body structures: Secondary | ICD-10-CM

## 2022-01-15 DIAGNOSIS — L905 Scar conditions and fibrosis of skin: Secondary | ICD-10-CM

## 2022-01-15 DIAGNOSIS — R0602 Shortness of breath: Secondary | ICD-10-CM | POA: Diagnosis not present

## 2022-01-15 NOTE — Patient Instructions (Signed)
Blood work for predisposition to scarring-as you stated, you may have this done through your primary care doctor's office  -ANA -ESR -Myositis panel -CRP  -antisynthetase antibody -Anti-Citrulliated peptide -Rheumatoid factor  Repeat CT scan of the chest in a year  Schedule for pulmonary function test  Graded exercises as tolerated  I will see you a year from now

## 2022-01-15 NOTE — Progress Notes (Signed)
Robin Brady    [id number]    29-Mar-1946  Primary Care Physician:Kaplan, Baldemar Friday., PA-C  Referring Physician: Aletha Halim., PA-C [address],  [place] [address]  Chief complaint:   In for follow-up for abnormal chest x-ray with concern for emphysema Shortness of breath  HPI:  Shortness of breath is a lot better  Functioning well with no significant limitations at present  Smokes about 1-2 cigarettes a day, has not been able to quit Occasional cough with minimal sputum production  Still has occasional flank Not limited with activities of daily living  Denies any chest pains or chest discomfort  Medical history of diabetes-well-controlled, hypertension, hyperlipidemia Pancreatitis  Does not recollect having any history of severe pneumonia in the past  We did look up old CT scan of the abdomen, 1 performed recently when she was being evaluated for pancreatitis that shows thickened interstitium, previous CT abdomen from a couple years ago and revealed bilateral basal haziness Had a CT scan of the chest 01/09/2022 showing basilar fibrotic changes, unchanged from previous  She smokes about 1-2 sticks of cigarettes a day Was never a heavy smoker  Worked as a Marine scientist  No weight loss Has had multiple corneal transplants  Outpatient Encounter Medications as of 01/15/2022  Medication Sig   aspirin EC 81 MG tablet Take 81 mg by mouth every Monday, Wednesday, and Friday.   Calcium Carbonate-Vitamin D 600-400 MG-UNIT tablet Take 1 tablet by mouth daily.   glucose blood (PRECISION QID TEST) test strip Test blood sugars twice a day and as needed   metFORMIN (GLUCOPHAGE-XR) 500 MG 24 hr tablet Take 500 mg by mouth daily with breakfast.   metoprolol succinate (TOPROL-XL) 25 MG 24 hr tablet Take 25 mg by mouth daily.   metroNIDAZOLE (METROGEL) 0.75 % gel Apply 1 application topically daily.   mometasone (ELOCON) 0.1 % cream Apply 1 application topically  daily as needed (for rash).    Multiple Vitamins-Minerals (MULTIVITAMIN PO) Take 1 tablet by mouth daily.   Omega 3 1000 MG CAPS Take 3,000 mg by mouth 2 (two) times daily with a meal.    polyethylene glycol (MIRALAX / GLYCOLAX) packet Take 17 g by mouth daily.   prednisoLONE acetate (PRED FORTE) 1 % ophthalmic suspension Place 1 drop into both eyes daily.   rosuvastatin (CRESTOR) 5 MG tablet Take 5 mg by mouth every Monday, Wednesday, Friday, and Saturday at 6 PM. M/w/f/s   vitamin B-12 (CYANOCOBALAMIN) 1000 MCG tablet Take 1,000 mcg by mouth daily. M/w/f/s   [DISCONTINUED] cephALEXin (KEFLEX) 500 MG capsule Take 1 capsule (500 mg total) by mouth 4 (four) times daily. (Patient not taking: Reported on 08/15/2021)   [DISCONTINUED] Cholecalciferol (VITAMIN D) 2000 UNITS CAPS Take 2,000 Units by mouth daily.  (Patient not taking: Reported on 08/15/2021)   No facility-administered encounter medications on file as of 01/15/2022.    Allergies as of 01/15/2022 - Review Complete 01/15/2022  Allergen Reaction Noted   Penicillins Hives and Other (See Comments) 07/23/2013   Statins Other (See Comments) 07/18/2016   Sulfa antibiotics Swelling and Other (See Comments) 07/23/2013   Tussionex pennkinetic er [hydrocod poli-chlorphe poli er] Other (See Comments) 07/23/2013   Latex Swelling, Rash, and Other (See Comments) 07/18/2016   Other Other (See Comments) 04/08/2020    Past Medical History:  Diagnosis Date   Anemia    Arthritis    OA   Constipation    Diabetes ([place])  Diabetes mellitus without complication (HCC)    Dysrhythmia    HX PALPITATIONS    Hyperlipemia    Hypertension    Loss of consciousness (HCC)    Pancreatitis    Ptosis    Thyroid nodule    8-10 YRS  NO CHANGE    Past Surgical History:  Procedure Laterality Date   BREAST CYSTECTOMY Bilateral 2703   w/ Silicone implants   BREAST IMPLANT REMOVAL Bilateral 1989   Replaced w/ saline implants   CARDIOVASCULAR STRESS TEST   09/30/2008   No evidence of signifcant ischemia.    CAROTID ANGIOGRAM  06/03/2007   No evidence to suggest occlusion, stenosis, dissection, aneurysm, or dural AV fistulas on images provided. ophthalmic artierieshave normal originsw/ normal capillary blush.   CATARACT EXTRACTION Bilateral 2006   CHOLECYSTECTOMY N/A 08/28/2017   Procedure: LAPAROSCOPIC CHOLECYSTECTOMY WITH INTRAOPERATIVE CHOLANGIOGRAM;  Surgeon: Coralie Keens, MD;  Location: [facility];  Service: General;  Laterality: N/A;   CORNEAL TRANSPLANT Right 1960, 1986, 1991, & 2004   PARS PLANA VITRECTOMY W/ REPAIR OF MACULAR HOLE  2009   PILONIDAL CYST [place]    Family History  Problem Relation Age of Onset   Multiple myeloma Mother    Heart failure Father    Diabetes Father    Heart attack Father 48   Diabetes Brother 109   Hypertension Brother 58   Diabetes Paternal Grandmother    Crohn's disease Brother 24   Diabetes Maternal Grandfather    Diabetes Brother 83   Alcoholism Brother    Ulcers Brother        Foot ulcers - developed MRSA following, 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    Social History   Socioeconomic History   Marital status: Married    Spouse name: willie   Number of children: 2   Years of education: college   Highest education level: Not on file  Occupational History   Occupation: [employer]    Employer: [employer]  Tobacco Use   Smoking status: Some Days    Packs/day: 0.25    Types: Cigarettes    Start date: 07/02/1990   Smokeless tobacco: Never   Tobacco comments:    1/2 cig per day. Hsm 01/15/22.   Vaping Use   Vaping Use: Never used  Substance and Sexual Activity   Alcohol use: No   Drug use: No   Sexual activity: Not on file  Other Topics Concern   Not on file  Social History Narrative   Not on file   Social Determinants of Health   Financial Resource Strain: Not on file  Food Insecurity: Not on file  Transportation Needs: Not on  file  Physical Activity: Not on file  Stress: Not on file  Social Connections: Not on file  Intimate Partner Violence: Not on file    Review of Systems  Constitutional:  Negative for activity change.  HENT: Negative.    Respiratory:  Positive for cough. Negative for shortness of breath.   Gastrointestinal: Negative.   Psychiatric/Behavioral:  Negative for sleep disturbance.     Vitals:   01/15/22 1440  BP: 110/64  Pulse: 61  Temp: 98.8 F (37.1 C)  SpO2: 99%     Physical Exam HENT:     Head: Normocephalic.     Nose: No congestion or rhinorrhea.     Mouth/Throat:     Mouth: Mucous membranes are moist.     Pharynx: No oropharyngeal exudate.  Eyes:     General:        Right eye: No discharge.        Left eye: No discharge.  Cardiovascular:     Rate and Rhythm: Normal rate and regular rhythm.     Heart sounds: No murmur heard. Pulmonary:     Effort: No respiratory distress.     Breath sounds: No stridor. No wheezing or rhonchi.  Musculoskeletal:     Cervical back: No rigidity or tenderness.  Neurological:     Mental Status: She is alert.  Psychiatric:        Mood and Affect: Mood normal.   Data Reviewed: Abdominal CT 10/09/2020 reviewed showing thickened interstitial Previous abdominal CT from 2018 shows multifocal infiltrates at bases bilaterally  CT scan with interstitial changes.  High-resolution CT showing lower lobe predominant interstitial process.  Similar process was present on CT from 2018-actually shows some improvement PFT reviewed with the patient was within normal limits  Repeat CT scan of the chest 01/09/2022 reviewed with the patient, unchanged from previous  Assessment:   Cough, shortness of breath  Abnormal CT scan of the chest -Fibrotic changes at the bases of the lungs  Has significantly cut down on smoking  Cough is not limiting  Activity level remains maintained  Plan/Recommendations: Will obtain high-resolution CT scan repeat in  a year-2024  Obtain a pulmonary function test repeat in a year-will be scheduled  -Obtain blood work including ANA, ESR, myositis panel, CRP, antisynthetase antibody, anti-Citrulline aided peptide, rheumatoid factor-request that this be done at the primary care doctor's office, has follow-up in a few weeks  Smoking cessation counseling  Follow-up a year from now  Encouraged to call with any significant concerns  I spent 30 minutes dedicated to the care of this patient on the date of this encounter to include previsit review of records, face-to-face time with the patient discussing conditions above, post visit ordering of testing, clinical documentation with electronic health record, making appropriate referrals as documented, and communicated necessary findings to members of the patient's care team   Sherrilyn Rist MD [facility] 01/15/2022, 2:51 PM  CC: Aletha Halim., PA-C

## 2022-01-16 ENCOUNTER — Encounter: Payer: Self-pay | Admitting: Pulmonary Disease

## 2022-02-15 ENCOUNTER — Ambulatory Visit (INDEPENDENT_AMBULATORY_CARE_PROVIDER_SITE_OTHER): Payer: Medicare Other | Admitting: Pulmonary Disease

## 2022-02-15 DIAGNOSIS — R0602 Shortness of breath: Secondary | ICD-10-CM | POA: Diagnosis not present

## 2022-02-15 LAB — PULMONARY FUNCTION TEST
DL/VA % pred: 66 %
DL/VA: 2.72 ml/min/mmHg/L
DLCO cor % pred: 64 %
DLCO cor: 11.97 ml/min/mmHg
DLCO unc % pred: 64 %
DLCO unc: 11.97 ml/min/mmHg
FEF 25-75 Post: 1.9 L/sec
FEF 25-75 Pre: 1.67 L/sec
FEF2575-%Change-Post: 13 %
FEF2575-%Pred-Post: 124 %
FEF2575-%Pred-Pre: 109 %
FEV1-%Change-Post: 1 %
FEV1-%Pred-Post: 98 %
FEV1-%Pred-Pre: 96 %
FEV1-Post: 1.96 L
FEV1-Pre: 1.93 L
FEV1FVC-%Change-Post: -1 %
FEV1FVC-%Pred-Pre: 103 %
FEV6-%Change-Post: 3 %
FEV6-%Pred-Post: 102 %
FEV6-%Pred-Pre: 98 %
FEV6-Post: 2.59 L
FEV6-Pre: 2.51 L
FEV6FVC-%Pred-Post: 105 %
FEV6FVC-%Pred-Pre: 105 %
FVC-%Change-Post: 3 %
FVC-%Pred-Post: 96 %
FVC-%Pred-Pre: 93 %
FVC-Post: 2.59 L
FVC-Pre: 2.51 L
Post FEV1/FVC ratio: 76 %
Post FEV6/FVC ratio: 100 %
Pre FEV1/FVC ratio: 77 %
Pre FEV6/FVC Ratio: 100 %
RV % pred: 102 %
RV: 2.36 L
TLC % pred: 100 %
TLC: 4.99 L

## 2022-02-15 NOTE — Patient Instructions (Signed)
Full PFT Performed Today  

## 2022-02-15 NOTE — Progress Notes (Signed)
Full PFT Performed Today  

## 2022-02-19 ENCOUNTER — Telehealth: Payer: Self-pay | Admitting: Pulmonary Disease

## 2022-02-19 DIAGNOSIS — R9389 Abnormal findings on diagnostic imaging of other specified body structures: Secondary | ICD-10-CM

## 2022-02-19 DIAGNOSIS — R0602 Shortness of breath: Secondary | ICD-10-CM

## 2022-02-19 NOTE — Telephone Encounter (Signed)
Plan/Recommendations: Will obtain high-resolution CT scan repeat in a year-2024   Obtain a pulmonary function test repeat in a year-will be scheduled   -Obtain blood work including ANA, ESR, myositis panel, CRP, antisynthetase antibody, anti-Citrulline aided peptide, rheumatoid factor-request that this be done at the primary care doctor's office, has follow-up in a few weeks      Attempted to call pt but unable to reach.left message for her to return call. Will await to place lab orders after hearing from pt.

## 2022-03-01 NOTE — Telephone Encounter (Signed)
Lab orders have been placed and have also been printed and placed in the mail for pt. Nothing further needed.

## 2022-03-21 ENCOUNTER — Other Ambulatory Visit: Payer: Self-pay | Admitting: Pulmonary Disease

## 2022-04-04 ENCOUNTER — Encounter: Payer: Self-pay | Admitting: Pulmonary Disease

## 2022-04-04 NOTE — Telephone Encounter (Signed)
Spoke with Irma and she is looking into this to see how come the labs are not back yet.

## 2022-04-05 LAB — MYOMARKER 3 PLUS PROFILE (RDL)

## 2022-04-05 LAB — ANA 12 PLUS PROFILE, POSITIVE
Anti-Cardiolipin Ab, IgA (RDL): 22 APL U/mL — ABNORMAL HIGH (ref <12)
Anti-Cardiolipin Ab, IgG (RDL): <15 GPL U/mL (ref <15)
Anti-Cardiolipin Ab, IgM (RDL): <13 MPL U/mL (ref <13)
Anti-Centromere Ab (RDL): 1:320 {titer} — ABNORMAL HIGH
Anti-Chromatin Ab, IgG (RDL): <20 Units (ref <20)
Anti-La (SS-B) Ab (RDL): <20 Units (ref <20)
Anti-Ro (SS-A) Ab (RDL): <20 Units (ref <20)
Anti-Scl-70 Ab (RDL): <20 Units (ref <20)
Anti-Sm Ab (RDL): <20 Units (ref <20)
Anti-TPO Ab (RDL): <9 IU/mL (ref <9.0)
Anti-dsDNA Ab by Farr(RDL): <8 IU/mL (ref <8.0)
C3 Complement (RDL): 170 mg/dL — ABNORMAL HIGH (ref 82–167)
C4 Complement (RDL): 27 mg/dL (ref 14–44)
Centromere Pattern: 1:320 {titer} — ABNORMAL HIGH

## 2022-04-05 LAB — C-REACTIVE PROTEIN: CRP: 1 mg/L (ref 0–10)

## 2022-04-05 LAB — SEDIMENTATION RATE: Sed Rate: 16 mm/hr (ref 0–40)

## 2022-04-05 LAB — ANTI-JO-1 AB (RDL)

## 2022-04-05 LAB — RHEUMATOID FACTOR: Rheumatoid fact SerPl-aCnc: <10 IU/mL (ref <14.0)

## 2022-04-05 LAB — ANTI-CCP AB, IGG + IGA (RDL): Anti-CCP Ab, IgG + IgA (RDL): <20 Units (ref <20)

## 2022-04-05 LAB — ANA 12 PLUS PROFILE (RDL): Anti-Nuclear Ab by IFA (RDL): POSITIVE — AB

## 2022-04-10 ENCOUNTER — Encounter: Payer: Self-pay | Admitting: Pulmonary Disease

## 2022-04-10 NOTE — Telephone Encounter (Signed)
Dr. Ander Slade, please advise on pts message. Pt is requesting results to her lab work. Thanks.

## 2022-04-12 ENCOUNTER — Telehealth: Payer: Self-pay | Admitting: Pulmonary Disease

## 2022-04-12 NOTE — Telephone Encounter (Signed)
Place referral to rheumatology  ANA positive, anticentromere positive

## 2022-04-12 NOTE — Progress Notes (Signed)
Abnormal study may be seen in connective tissue disorder  This may be associated with scarring in the lungs  Evaluation with rheumatology is appropriate in this situation, we will make a referral to rheumatology if that is acceptable

## 2022-04-17 NOTE — Telephone Encounter (Signed)
Called patient and left a message for the patient to call back.

## 2022-04-19 NOTE — Telephone Encounter (Signed)
I left a voice mail for the patient with results per DPR and she was asked to call back if she was agreeable to the referral. Waiting on a call back.

## 2022-04-23 NOTE — Telephone Encounter (Signed)
No call back.     Closing encounter.

## 2022-04-25 ENCOUNTER — Telehealth: Payer: Self-pay | Admitting: Cardiovascular Disease

## 2022-04-25 NOTE — Telephone Encounter (Signed)
Patient c/o Palpitations:  High priority if patient c/o lightheadedness, shortness of breath, or chest pain  How long have you had palpitations/irregular HR/ Afib? Are you having the symptoms now? Since Sunday, yes  Are you currently experiencing lightheadedness, SOB or CP? no  Do you have a history of afib (atrial fibrillation) or irregular heart rhythm? yes  Have you checked your BP or HR? (document readings if available): HR 66-70  Are you experiencing any other symptoms? no   Patient states her heart has been skipping beats since Sunday. She says it had gotten, but but now it's not.

## 2022-04-25 NOTE — Telephone Encounter (Signed)
Patient stated that this past Sunday, she began to fill palpitations. They come and go. She denies sob, dizziness, lightheadedness. She is taking medications as prescribed. Appointment made with [facility] for 10/27. Recommended that if she does have the above mentioned symptoms with palpitations, to go to the ED. She verbalized understanding.

## 2022-04-27 ENCOUNTER — Encounter: Payer: Self-pay | Admitting: Physician Assistant

## 2022-04-27 ENCOUNTER — Ambulatory Visit: Payer: Medicare Other | Attending: Physician Assistant | Admitting: Physician Assistant

## 2022-04-27 VITALS — BP 112/74 | HR 63 | Wt 158.0 lb

## 2022-04-27 DIAGNOSIS — E782 Mixed hyperlipidemia: Secondary | ICD-10-CM

## 2022-04-27 DIAGNOSIS — I10 Essential (primary) hypertension: Secondary | ICD-10-CM | POA: Diagnosis not present

## 2022-04-27 DIAGNOSIS — R768 Other specified abnormal immunological findings in serum: Secondary | ICD-10-CM | POA: Diagnosis not present

## 2022-04-27 DIAGNOSIS — R002 Palpitations: Secondary | ICD-10-CM

## 2022-04-27 DIAGNOSIS — E119 Type 2 diabetes mellitus without complications: Secondary | ICD-10-CM

## 2022-04-27 LAB — TSH+FREE T4
Free T4: 1.17 ng/dL (ref 0.82–1.77)
TSH: 2.81 u[IU]/mL (ref 0.450–4.500)

## 2022-04-27 MED ORDER — METOPROLOL SUCCINATE ER 25 MG PO TB24: 37.5000 mg | ORAL_TABLET | Freq: Every day | ORAL | 3 refills | Status: DC
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
## 2022-04-27 NOTE — Patient Instructions (Addendum)
Medication Instructions:  INCREASE Metoprolol Succinate (Toprol-XL) to 37.5 mg (1.5 tablets) daily   *If you need a refill on your cardiac medications before your next appointment, please call your pharmacy*  Lab Work: Your physician recommends that you return for lab work TODAY:  TSH+Free T4  If you have labs (blood work) drawn today and your tests are completely normal, you will receive your results only by: [place] (if you have MyChart) OR A paper copy in the mail If you have any lab test that is abnormal or we need to change your treatment, we will call you to review the results.  Testing/Procedures:  Almyra Deforest, PA-C has requested that you have an echocardiogram. Echocardiography is a painless test that uses sound waves to create images of your heart. It provides your doctor with information about the size and shape of your heart and how well your heart's chambers and valves are working. This procedure takes approximately one hour. There are no restrictions for this procedure. Please do NOT wear cologne, perfume, aftershave, or lotions (deodorant is allowed). Please arrive 15 minutes prior to your appointment time.  Follow-Up: At [facility], you and your health needs are our priority.  As part of our continuing mission to provide you with exceptional heart care, we have created designated Provider Care Teams.  These Care Teams include your primary Cardiologist (physician) and Advanced Practice Providers (APPs -  Physician Assistants and Nurse Practitioners) who all work together to provide you with the care you need, when you need it.  Your next appointment:   6-8 week(s)  The format for your next appointment:   In Person  Provider:   Coletta Memos, FNP, Fabian Sharp, PA-C, Sande Rives, PA-C, Caron Presume, PA-C, Jory Sims, DNP, ANP, Almyra Deforest, PA-C, or Diona Browner, NP        Other Instructions You have been referred to rheumatology service    Important Information About Sugar

## 2022-04-30 NOTE — Telephone Encounter (Signed)
Routing My Chart message to Dr. Ander Slade as FYI:   [facility] (supporting Laurin Coder, MD) 3 days ago    My cardiologist is making the appointment with Dr  Amil Amen. I worked with him for several years  Nothing further needed at this time.

## 2022-05-14 ENCOUNTER — Ambulatory Visit (HOSPITAL_COMMUNITY): Payer: Medicare Other | Attending: Physician Assistant

## 2022-05-14 DIAGNOSIS — R002 Palpitations: Secondary | ICD-10-CM | POA: Insufficient documentation

## 2022-05-14 DIAGNOSIS — I5030 Unspecified diastolic (congestive) heart failure: Secondary | ICD-10-CM

## 2022-05-14 LAB — ECHOCARDIOGRAM COMPLETE
Area-P 1/2: 4.15 cm2
S' Lateral: 2.3 cm

## 2022-05-14 MED ORDER — PERFLUTREN LIPID MICROSPHERE
1.0000 mL | INTRAVENOUS | Status: AC | PRN
  Administered 2022-05-14: 1 mL via INTRAVENOUS
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
## 2022-06-05 ENCOUNTER — Encounter: Payer: Self-pay | Admitting: General Practice

## 2022-06-05 ENCOUNTER — Ambulatory Visit: Payer: Medicare Other | Attending: General Practice | Admitting: General Practice

## 2022-06-05 VITALS — BP 122/64 | HR 69 | Ht 63.5 in | Wt 158.4 lb

## 2022-06-05 DIAGNOSIS — R002 Palpitations: Secondary | ICD-10-CM

## 2022-06-05 DIAGNOSIS — I10 Essential (primary) hypertension: Secondary | ICD-10-CM | POA: Diagnosis not present

## 2022-06-05 DIAGNOSIS — E782 Mixed hyperlipidemia: Secondary | ICD-10-CM

## 2022-06-05 DIAGNOSIS — E119 Type 2 diabetes mellitus without complications: Secondary | ICD-10-CM | POA: Diagnosis not present

## 2022-06-05 MED ORDER — METOPROLOL SUCCINATE ER 25 MG PO TB24: 25.0000 mg | ORAL_TABLET | Freq: Every day | ORAL | 3 refills | Status: AC

## 2022-06-05 NOTE — Patient Instructions (Signed)
Medication Instructions:  DECREASE YOUR METOPROLOL 25MG  DAILY *If you need a refill on your cardiac medications before your next appointment, please call your pharmacy*  Lab Work: NONE If you have labs (blood work) drawn today and your tests are completely normal, you will receive your results only by:  MyChart Message (if you have MyChart) OR  A paper copy in the mail If you have any lab test that is abnormal or we need to change your treatment, we will call you to review the results.  Testing/Procedures: NONE   Follow-Up: At [facility], you and your health needs are our priority.  As part of our continuing mission to provide you with exceptional heart care, we have created designated Provider Care Teams.  These Care Teams include your primary Cardiologist (physician) and Advanced Practice Providers (APPs -  Physician Assistants and Nurse Practitioners) who all work together to provide you with the care you need, when you need it.  Your next appointment:   6 month(s)  The format for your next appointment:   In Person  Provider:   [facility], MD     Other Instructions INCREASE PHYSICAL ACTIVITY AS TOLERATED  Important Information About Sugar

## 2022-12-07 ENCOUNTER — Encounter: Payer: Self-pay | Admitting: Cardiovascular Disease

## 2022-12-07 ENCOUNTER — Ambulatory Visit: Payer: Medicare Other | Attending: Cardiovascular Disease | Admitting: Cardiovascular Disease

## 2022-12-07 VITALS — BP 136/78 | HR 63 | Ht 63.5 in | Wt 156.4 lb

## 2022-12-07 DIAGNOSIS — I10 Essential (primary) hypertension: Secondary | ICD-10-CM | POA: Diagnosis not present

## 2022-12-07 DIAGNOSIS — R002 Palpitations: Secondary | ICD-10-CM | POA: Diagnosis not present

## 2022-12-07 DIAGNOSIS — E782 Mixed hyperlipidemia: Secondary | ICD-10-CM

## 2022-12-07 NOTE — Assessment & Plan Note (Signed)
History of palpitations in the past which is no longer an issue.  She is on beta-blocker.
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
## 2022-12-07 NOTE — Patient Instructions (Signed)
Medication Instructions:  Your physician recommends that you continue on your current medications as directed. Please refer to the Current Medication list given to you today.  *If you need a refill on your cardiac medications before your next appointment, please call your pharmacy*   Follow-Up: At [facility], you and your health needs are our priority.  As part of our continuing mission to provide you with exceptional heart care, we have created designated Provider Care Teams.  These Care Teams include your primary Cardiologist (physician) and Advanced Practice Providers (APPs -  Physician Assistants and Nurse Practitioners) who all work together to provide you with the care you need, when you need it.  We recommend signing up for the patient portal called "MyChart".  Sign up information is provided on this After Visit Summary.  MyChart is used to connect with patients for Virtual Visits (Telemedicine).  Patients are able to view lab/test results, encounter notes, upcoming appointments, etc.  Non-urgent messages can be sent to your provider as well.   To learn more about what you can do with MyChart, go to [email or web address].[email or web address].    Your next appointment:   6 month(s)  Provider:   Jesse Cleaver, FNP      Then, Jonathan Berry, MD will plan to see you again in 12 month(s).   

## 2022-12-07 NOTE — Assessment & Plan Note (Signed)
History of hyperlipidemia on low-dose Crestor followed by her PCP 

## 2022-12-07 NOTE — Assessment & Plan Note (Signed)
History of essential hypertension blood pressure measured at 136/78.  She is on metoprolol.

## 2023-01-08 ENCOUNTER — Encounter: Payer: Self-pay | Admitting: Cardiovascular Disease

## 2023-01-11 ENCOUNTER — Other Ambulatory Visit: Payer: Medicare Other

## 2023-01-16 ENCOUNTER — Other Ambulatory Visit: Payer: Self-pay | Admitting: Pulmonary Disease

## 2023-01-16 DIAGNOSIS — R9389 Abnormal findings on diagnostic imaging of other specified body structures: Secondary | ICD-10-CM

## 2023-01-16 DIAGNOSIS — L905 Scar conditions and fibrosis of skin: Secondary | ICD-10-CM

## 2023-01-23 ENCOUNTER — Ambulatory Visit
Admission: RE | Admit: 2023-01-23 | Discharge: 2023-01-23 | Disposition: A | Discharge status: Home / Self Care | Payer: Medicare Other | Source: Ambulatory Visit | Attending: Pulmonary Disease | Admitting: Pulmonary Disease

## 2023-01-23 DIAGNOSIS — L905 Scar conditions and fibrosis of skin: Secondary | ICD-10-CM

## 2023-01-23 DIAGNOSIS — R9389 Abnormal findings on diagnostic imaging of other specified body structures: Secondary | ICD-10-CM

## 2023-02-12 ENCOUNTER — Ambulatory Visit: Payer: Medicare Other | Admitting: Pulmonary Disease

## 2023-02-12 ENCOUNTER — Encounter: Payer: Self-pay | Admitting: Pulmonary Disease

## 2023-02-12 VITALS — BP 114/60 | HR 61 | Ht 63.0 in | Wt 155.2 lb

## 2023-02-12 DIAGNOSIS — L905 Scar conditions and fibrosis of skin: Secondary | ICD-10-CM | POA: Diagnosis not present

## 2023-02-12 DIAGNOSIS — R9389 Abnormal findings on diagnostic imaging of other specified body structures: Secondary | ICD-10-CM

## 2023-02-12 NOTE — Patient Instructions (Signed)
Schedule for PFT to be done a year from now  Schedule for CT scan of the chest to be done a year from now-high-resolution CT chest  Call us with any significant changes in symptoms  I will see you a year from now

## 2023-02-12 NOTE — Progress Notes (Signed)
Robin Brady    [id number]    1945/08/23  Primary Care Physician:Kaplan, Isidor Holts., PA-C  Referring Physician: Richmond Campbell., PA-C [address]  [place] [address]  Chief complaint:   In for follow-up Seen last year  HPI:  Breathing has been relatively stable She does have a chronic cough, clear phlegm, not feeling acutely ill  History of smoking 1 to 2 cigarettes a day  She is not limited with actives of daily living  Denies any chest pains or chest discomfort  Did have a CT scan of her chest recently that we reviewed in the office today showing stable scarring at the bases  Medical history of diabetes-well-controlled, hypertension, hyperlipidemia Pancreatitis  Does not recollect having any history of severe pneumonia in the past  Previous CT scans was compared with current.  one performed recently when she was being evaluated for pancreatitis that shows thickened interstitium, previous CT abdomen from a couple years ago and revealed bilateral basal haziness Had a CT scan of the chest 01/09/2022 showing basilar fibrotic changes, unchanged from previous  She smokes about 1-2 sticks of cigarettes a day Was never a heavy smoker  Worked as a Engineer, civil (consulting)  No weight loss Has had multiple corneal transplants  Outpatient Encounter Medications as of 02/12/2023  Medication Sig   Calcium Carbonate-Vitamin D 600-400 MG-UNIT tablet Take 1 tablet by mouth daily.   glucose blood (PRECISION QID TEST) test strip daily.   metFORMIN (GLUCOPHAGE-XR) 500 MG 24 hr tablet Take 500 mg by mouth daily with breakfast.   metoprolol succinate (TOPROL-XL) 25 MG 24 hr tablet Take 1 tablet (25 mg total) by mouth daily.   metroNIDAZOLE (METROGEL) 0.75 % gel Apply 1 application topically daily.   mometasone (ELOCON) 0.1 % cream Apply 1 application topically daily as needed (for rash).    Multiple Vitamins-Minerals (MULTIVITAMIN PO) Take 1 tablet by mouth daily.   Omega 3 1000  MG CAPS Take 3,000 mg by mouth 2 (two) times daily with a meal.    polyethylene glycol (MIRALAX / GLYCOLAX) packet Take 17 g by mouth daily.   rosuvastatin (CRESTOR) 5 MG tablet Take 5 mg by mouth every Monday, Wednesday, Friday, and Saturday at 6 PM. M/w/f/s   vitamin B-12 (CYANOCOBALAMIN) 1000 MCG tablet Take 1,000 mcg by mouth daily. M/w/f/s   [DISCONTINUED] aspirin EC 81 MG tablet Take 81 mg by mouth every Monday, Wednesday, and Friday.   [DISCONTINUED] prednisoLONE acetate (PRED FORTE) 1 % ophthalmic suspension Place 1 drop into the right eye every Monday, Wednesday, and Friday.   No facility-administered encounter medications on file as of 02/12/2023.    Allergies as of 02/12/2023 - Review Complete 02/12/2023  Allergen Reaction Noted   Penicillins Hives and Other (See Comments) 07/23/2013   Statins Other (See Comments) 07/18/2016   Sulfa antibiotics Swelling and Other (See Comments) 07/23/2013   Tussionex pennkinetic er Larey Seat poli-chlorphe poli er] Other (See Comments) 07/23/2013   Latex Swelling, Rash, and Other (See Comments) 07/18/2016   Other Other (See Comments) 04/08/2020    Past Medical History:  Diagnosis Date   Anemia    Arthritis    OA   Constipation    Diabetes (HCC)    Diabetes mellitus without complication (HCC)    Dysrhythmia    HX PALPITATIONS    Hyperlipemia    Hypertension    Loss of consciousness (HCC)    Pancreatitis    Ptosis  Thyroid nodule    8-10 YRS  NO CHANGE    Past Surgical History:  Procedure Laterality Date   BREAST CYSTECTOMY Bilateral 1973   w/ Silicone implants   BREAST IMPLANT REMOVAL Bilateral 1989   Replaced w/ saline implants   CARDIOVASCULAR STRESS TEST  09/30/2008   No evidence of signifcant ischemia.    CAROTID ANGIOGRAM  06/03/2007   No evidence to suggest occlusion, stenosis, dissection, aneurysm, or dural AV fistulas on images provided. ophthalmic artierieshave normal originsw/ normal capillary blush.   CATARACT  EXTRACTION Bilateral 2006   CHOLECYSTECTOMY N/A 08/28/2017   Procedure: LAPAROSCOPIC CHOLECYSTECTOMY WITH INTRAOPERATIVE CHOLANGIOGRAM;  Surgeon: Abigail Miyamoto, MD;  Location: [facility] OR;  Service: General;  Laterality: N/A;   CORNEAL TRANSPLANT Right 1960, 1986, 1991, & 2004   PARS PLANA VITRECTOMY W/ REPAIR OF MACULAR HOLE  2009   PILONIDAL CYST EXCISION  1964   TONSILLECTOMY  1948    Family History  Problem Relation Age of Onset   Multiple myeloma Mother    Heart failure Father    Diabetes Father    Heart attack Father 54   Diabetes Brother 70   Hypertension Brother 99   Diabetes Paternal Grandmother    Crohn's disease Brother 24   Diabetes Maternal Grandfather    Diabetes Brother 25   Alcoholism Brother    Ulcers Brother        Foot ulcers - developed MRSA following, 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    Social History   Socioeconomic History   Marital status: Married    Spouse name: willie   Number of children: 2   Years of education: college   Highest education level: Not on file  Occupational History   Occupation: Armed forces operational officer medical associates    Employer: [employer]  Tobacco Use   Smoking status: Some Days    Current packs/day: 0.25    Average packs/day: 0.3 packs/day for 32.6 years (8.2 ttl pk-yrs)    Types: Cigarettes    Start date: 07/02/1990   Smokeless tobacco: Never   Tobacco comments:    1/2 cig per day. Hsm 01/15/22.     12/07/2022 patient smokes 1/2 to 1 cigarette daily  Vaping Use   Vaping status: Never Used  Substance and Sexual Activity   Alcohol use: No   Drug use: No   Sexual activity: Not on file  Other Topics Concern   Not on file  Social History Narrative   Not on file   Social Determinants of Health   Financial Resource Strain: Not on file  Food Insecurity: Not on file  Transportation Needs: Not on file  Physical Activity: Not on file  Stress: Not on file  Social Connections: Not on file  Intimate Partner Violence: Not on file     Review of Systems  Constitutional:  Negative for activity change.  HENT: Negative.    Respiratory:  Positive for cough. Negative for shortness of breath.   Gastrointestinal: Negative.   Psychiatric/Behavioral:  Negative for sleep disturbance.     Vitals:   02/12/23 1531  BP: 114/60  Pulse: 61  SpO2: 98%     Physical Exam HENT:     Head: Normocephalic.     Nose: No congestion or rhinorrhea.     Mouth/Throat:     Mouth: Mucous membranes are moist.     Pharynx: No oropharyngeal exudate.  Eyes:     General:        Right eye: No discharge.  Left eye: No discharge.  Cardiovascular:     Rate and Rhythm: Normal rate and regular rhythm.     Heart sounds: No murmur heard. Pulmonary:     Effort: No respiratory distress.     Breath sounds: No stridor. Rales present. No wheezing or rhonchi.  Musculoskeletal:     Cervical back: No rigidity or tenderness.  Neurological:     Mental Status: She is alert.  Psychiatric:        Mood and Affect: Mood normal.    Data Reviewed: Abdominal CT 10/09/2020 reviewed showing thickened interstitial Previous abdominal CT from 2018 shows multifocal infiltrates at bases bilaterally  CT scan with interstitial changes.  High-resolution CT showing lower lobe predominant interstitial process.  Similar process was present on CT from 2018-actually shows some improvement PFT reviewed with the patient was within normal limits  Repeat CT scan of the chest 01/09/2022 reviewed with the patient, unchanged from previous  Most recent CT scan of the chest 01/23/2023-basilar interstitial process, unchanged from previous  Assessment:   Chronic cough  Interstitial lung disease -Fibrotic pattern at the bases of the lung which have remained stable  Abnormal CT scan of the chest  Activity level maintained  No progression of symptoms, no progression of PFT findings, no progression of CT scan findings    Plan/Recommendations: Will repeat CT scan  of the chest high-resolution images in a year  Repeat pulmonary function test in a year  Encouraged to call with significant concerns  Follow-up a year from now  Encouraged to give [place] a call if any evolution of symptoms  I spent 30 minutes dedicated to the care of this patient on the date of this encounter to include previsit review of records, face-to-face time with the patient discussing conditions above, post visit ordering of testing, clinical documentation with electronic health record, and communicated necessary findings to members of the patient's care team   Virl Diamond MD [facility] 02/12/2023, 3:39 PM  CC: Richmond Campbell., PA-C
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
## 2023-06-10 ENCOUNTER — Ambulatory Visit: Payer: Medicare Other | Attending: General Practice | Admitting: General Practice

## 2023-06-10 ENCOUNTER — Encounter: Payer: Self-pay | Admitting: General Practice

## 2023-06-10 VITALS — BP 130/70 | HR 80 | Ht 63.0 in | Wt 154.6 lb

## 2023-06-10 DIAGNOSIS — I10 Essential (primary) hypertension: Secondary | ICD-10-CM | POA: Diagnosis not present

## 2023-06-10 DIAGNOSIS — R002 Palpitations: Secondary | ICD-10-CM

## 2023-06-10 DIAGNOSIS — E782 Mixed hyperlipidemia: Secondary | ICD-10-CM | POA: Diagnosis not present

## 2023-06-10 DIAGNOSIS — E119 Type 2 diabetes mellitus without complications: Secondary | ICD-10-CM | POA: Diagnosis not present

## 2023-06-10 NOTE — Patient Instructions (Signed)
Medication Instructions:  USE REGULAR MUCINEX AS NEEDED TO THIN SECRETIONS *If you need a refill on your cardiac medications before your next appointment, please call your pharmacy*  Lab Work: NONE  Other Instructions MAINTAIN YOUR PHYSICAL ACTIVITY  Follow-Up: At [facility], you and your health needs are our priority.  As part of our continuing mission to provide you with exceptional heart care, we have created designated Provider Care Teams.  These Care Teams include your primary Cardiologist (physician) and Advanced Practice Providers (APPs -  Physician Assistants and Nurse Practitioners) who all work together to provide you with the care you need, when you need it.  Your next appointment:   6-9 month(s)  Provider:   Nanetta Batty, MD  or Edd Fabian, FNP

## 2023-11-21 ENCOUNTER — Encounter: Payer: Self-pay | Admitting: Cardiovascular Disease

## 2023-12-02 NOTE — Progress Notes (Signed)
 [facility] Pulmonary and Critical Care Medicine   Patient Name: Robin Brady, 29-May-1946  Synopsis: Referred to [facility] pulmonary in April 2025 in the context of interstitial lung disease, cigarette smoking.  Previously followed by [facility] pulmonary for chronic cough, interstitial lung disease. Still smoking as of 10/2023  CC: ILD assessment  HPI:  History of Present Illness The patient has a history of cigarette smoking, upper lobe predominant centrilobular nodules on CT imaging, and fibrotic lung disease. She is here for follow-up, evaluation, and management of these conditions.  Cigarette Smoking - History of smoking, never more than a third of a pack per day - Efforts to reduce smoking habit - Perceives improvement in condition, with decreased mucus production  Interstitial Lung Disease - Upper lobe predominant centrilobular nodules on CT imaging - Fibrotic lung disease  Multiple Pulmonary Nodules - Recent CT chest showed new multiple pulmonary nodules  Supplemental information: No recent episodes of bronchitis, pneumonia, or new respiratory issues since her last visit. She has never experienced active bronchitis. Engages in yard work without significant respiratory distress. Attempted Breztri a few times but found it unnecessary. One instance of severe shortness of breath on a hot day, alleviated by [employer]. Symptoms generally improve with rest.  SOCIAL HISTORY History of cigarette smoking, never more than a third of a pack per day.  MEDICATIONS Breztri    Past Medical History:  Diagnosis Date  . Absolute anemia 12/11/2006  . Arthritis   . Cataract   . Compression fracture of L1 vertebra with routine healing 07/24/2013  . Diabetes    (CMD)   . Diabetes mellitus    (CMD)    Med Hx: Diabetes mellitus;   . High cholesterol   . Hypercholesterolemia   . Idiopathic acute pancreatitis (CMD) 07/30/2016  . Other chronic pancreatitis    (CMD)  02/20/2022  . Pancreatitis (CMD)    Past Surgical History:  Procedure Laterality Date  . BLEPHAROPLASTY Bilateral 02/03/2015   Procedure: BLEPHAROPLASTY UPPER LIDS;  Surgeon: Geofm Macario Riding, MD PHD;  Location: [facility] OUTPATIENT OR;  Service: Ophthalmology;  Laterality: Bilateral;  . CATARACT EXTRACTION     Procedure: CATARACT EXTRACTION  . CHOLECYSTECTOMY     Procedure: CHOLECYSTECTOMY  . CORNEAL TRANSPLANT     Procedure: CORNEAL TRANSPLANT; OU  . DENTAL SURGERY      Procedure: DENTAL SURGERY  . LEVATOR ADVANCEMENT/RESECTION Right 02/03/2015   Procedure: LEVATOR RESECTION FOR PTOSIS;  Surgeon: Geofm Macario Riding, MD PHD;  Location: [facility] OUTPATIENT OR;  Service: Ophthalmology;  Laterality: Right;  no blood thinners  . MIDDLE EAR SURGERY     Procedure: MIDDLE EAR SURGERY  . OTHER SURGICAL HISTORY     Procedure: OTHER SURGICAL HISTORY (pilonidal cyst resection)  . OTHER SURGICAL HISTORY     Procedure: OTHER SURGICAL HISTORY (breast surgery)  . PARS PLANA VITRECTOMY W/ REPAIR OF MACULAR HOLE Right    Procedure: PARS PLANA VITRECTOMY W/ REPAIR OF MACULAR HOLE  . TONSILLECTOMY AND ADENOIDECTOMY     Procedure: TONSILLECTOMY AND ADENOIDECTOMY  . TUBAL LIGATION     Procedure: TUBAL LIGATION       Exam: Vitals:   12/03/23 1044  BP: 120/65  Pulse: 65  Resp: 18  Temp: 97.9 F (36.6 C)  SpO2: 96%     Gen: well appearing, no acute distress HEENT: NCAT OP clear EOMi PULM: CTA B, normal effort CV: RRR, no mgr GI: BS+, soft, nontender MSK: normal bulk and tone Neuro: awake and  alert, MAEW    STUDIES Imaging: May 2025 high-resolution CT scan of the chest showed diffuse bronchial wall thickening bilateral upper lobe predominant patchy groundglass opacities likely represents respiratory bronchiolitis interstitial lung disease.  Basilar dependent fibrosis.  Indeterminate for UIP.,  Indeterminate subsix Miller pulmonary nodule, repeat in 12 months July 2024 CT chest showed a basilar  interstitial process unchanged compared to 2023 2023 high-resolution CT chest showed mild basilar pulmonary fibrosis unchanged from June 2022, may be due to nonspecific interstitial pneumonitis, indeterminate for UIP per consensus guidelines.  Minimal air trapping indicative of small airways disease, chronic calcific pancreatitis 2018 abdominal CT chest showed multifocal infiltrates in the bases bilaterally  Labs: 2023 mild marker panel negative, ANA positive with centromere pattern 1:320, SSA negative, rheumatoid factor negative, CCP negative, cardiolipin slightly positive IgA, cardiolipin IgG and IgM both negative, SCL 70 negative, anti-smooth muscle antibody negative, anti-double-stranded DNA negative,  Path:   Micro:   PFT: August 2023 pulmonary function test ratio 76%, FVC 2.59 L (96%), TLC 4.99 (100%), DLCO 11.97 (64%) Pulmonary Functions Testing Results:  FEV1  Date Value Ref Range Status  10/22/2023 1.90 L    FEV1/FVC  Date Value Ref Range Status  10/22/2023 75 %   May 2025 full PFT: Ratio 68%, FEV1 1.80 L (95%), FVC 2.65L (107%) total lung capacity 4.77 (117%), DLCO 10.04 (56%                       Echo:   Heart Cath:   Sleep study:   Ambulatory oximetry: October 22, 2023 6-minute walk O2 saturation at rest 99% on room air, walked 1000 feet on room air and O2 saturation dropped no lower than 97%  Impression: Diagnoses and all orders for this visit: Mucopurulent chronic bronchitis    (CMD) Cigarette smoker ILD (interstitial lung disease)    (CMD) -     [facility] Wake-Pulmonary Function Test Spirometry Before and After Bronchodilator; Carbon Monoxide Diffusing Capacity(DLCO), Lung Volumes; Future Multiple pulmonary nodules -     CT Chest WO Contrast High Resolution; Future Respiratory bronchiolitis associated interstitial lung disease    (CMD) -     CT Chest WO Contrast High Resolution; Future -     [facility]-Pulmonary Function Test Spirometry Before and After  Bronchodilator; Carbon Monoxide Diffusing Capacity(DLCO), Lung Volumes; Future     Plan: Assessment & Plan 1. Respiratory Bronchiolitis Interstitial Lung Disease: Stable. Ground-glass opacities in upper lobes consistent with respiratory bronchiolitis interstitial lung disease, likely due to cigarette smoking. - Annual lung function tests and CT scans recommended - Smoking cessation strongly advised - Repeat lung function test and CT scan scheduled for May 2026 - Report any respiratory issues such as bronchitis or pneumonia immediately  2. Multiple Pulmonary Nodules: Recent CT imaging revealed new multiple pulmonary nodules, all under 6 mm. - Nodules not currently worrisome but require monitoring - Annual CT scans recommended - Repeat CT scan scheduled for May 2026 - Further evaluation necessary if changes in size or characteristics noted  3. Chronic Bronchitis: History of chronic bronchitis with daily mucus production but no recent acute episodes. - Continue monitoring symptoms and report significant changes or worsening - Smoking cessation strongly recommended - resume Breztri if symptoms worsen  Follow-up - Repeat lung function test and CT scan scheduled for May 2026 - Report any respiratory issues such as bronchitis or pneumonia immediately      Current Outpatient Medications  Medication Instructions  . budesonide-glycopyr-formoterol (Breztri Aerosphere) 160-9-4.8 mcg/actuation  inhaler 2 puffs, inhalation, 2 times daily  . calcium  carbonate-vit D3-min 600 mg calcium - 400 unit tab 1 tablet, Daily  . glucose blood (OneTouch Ultra Test) test strip Use as instructed  . Lancets misc Ultra Soft Lancets. Test blood glucose BID  . metFORMIN  (GLUCOPHAGE -XR) 500 mg 24 hr tablet Take one pill per day  . metoprolol  succinate (TOPROL  XL) 25 mg, oral, Daily  . metroNIDAZOLE  (ROSADAN) 0.75 % gel gel topical, Daily  . microfibrillar collagen (Avitene Flour) powder 1 g, As needed  .  mometasone  (ELOCON ) 0.1 % cream topical, Daily  . omega 3-dha-epa-fish oil (OMEGA 3) 1,000 mg capsule 3 g, 2 times daily  . polyethylene glycol (MIRALAX ) 17 gram powd powder Take  by mouth.  . prednisoLONE  acetate (PRED FORTE ) 1 % ophthalmic suspension 1 drop, Daily  . psyllium (METAMUCIL/KONSYL) 1 packet 1 packet, Daily  . rosuvastatin  (CRESTOR ) 5 mg, oral, Daily, 1 pill 4 times a week.  . vit B complex no.12/niacin,B3, (VITAMIN B COMPLEX NO.12-NIACIN ORAL) Take by mouth. 4 times a week   Immunization History  Administered Date(s) Administered  . Influenza, Injectable, Quadrivalent, Preservative Free 05/03/2020  . Influenza, Unspecified 05/02/2016, 05/16/2017, 04/28/2018, 05/08/2019  . Influenza, high-dose, trivalent, PF 05/02/2016, 05/16/2021, 04/28/2022  . Influenza, split virus, trivalent, preservative 04/28/2018  . Influenza,split virus, trivalent, PF 05/09/2023  . Pfizer SARS-CoV-2 Primary Series 12+ yrs 08/22/2019, 09/15/2019, 05/19/2020  . Pneumococcal Conjugate 13-Valent 07/27/2015  . Pneumococcal Conjugate Vaccine 20-Valent (PREVNAR-20) 6 wks+ 09/05/2023  . Pneumococcal Polysaccharide Vaccine, 23 Valent (PNEUMOVAX-23) 2Y+ 03/31/2010, 01/09/2017  . Pneumococcal, Unspecified 03/31/2010, 07/27/2015, 01/09/2017  . RSV, PF (ABRYSVO) - Pregnancy 32 to 36 weeks ONLY 02/26/2023  . TDAP VACCINE (BOOSTRIX,ADACEL) 7Y+ 12/11/2006, 06/08/2014  . Varicella Zoster [facility]) 18Y+ 04/23/2019, 06/29/2019  . Zoster, Live 01/17/2012  . Zoster, Unspecified 04/23/2019    Thresa Lennert MD [facility] Pulmonary and Critical Care  Mobile [phone] Office: [phone]

## 2024-01-01 ENCOUNTER — Telehealth (HOSPITAL_BASED_OUTPATIENT_CLINIC_OR_DEPARTMENT_OTHER): Payer: Self-pay | Admitting: Emergency Medicine

## 2024-01-01 ENCOUNTER — Emergency Department (HOSPITAL_BASED_OUTPATIENT_CLINIC_OR_DEPARTMENT_OTHER)

## 2024-01-01 ENCOUNTER — Encounter (HOSPITAL_BASED_OUTPATIENT_CLINIC_OR_DEPARTMENT_OTHER): Payer: Self-pay

## 2024-01-01 ENCOUNTER — Emergency Department (HOSPITAL_BASED_OUTPATIENT_CLINIC_OR_DEPARTMENT_OTHER)
Admission: EM | Admit: 2024-01-01 | Discharge: 2024-01-01 | Disposition: A | Discharge status: Home / Self Care | Attending: Emergency Medicine | Admitting: Emergency Medicine

## 2024-01-01 ENCOUNTER — Other Ambulatory Visit: Payer: Self-pay

## 2024-01-01 DIAGNOSIS — Z7984 Long term (current) use of oral hypoglycemic drugs: Secondary | ICD-10-CM | POA: Diagnosis not present

## 2024-01-01 DIAGNOSIS — Z91040 Latex allergy status: Secondary | ICD-10-CM | POA: Insufficient documentation

## 2024-01-01 DIAGNOSIS — E119 Type 2 diabetes mellitus without complications: Secondary | ICD-10-CM | POA: Diagnosis not present

## 2024-01-01 DIAGNOSIS — R109 Unspecified abdominal pain: Secondary | ICD-10-CM | POA: Diagnosis present

## 2024-01-01 DIAGNOSIS — N200 Calculus of kidney: Secondary | ICD-10-CM

## 2024-01-01 LAB — COMPREHENSIVE METABOLIC PANEL WITH GFR
ALT: 14 U/L (ref 0–44)
AST: 30 U/L (ref 15–41)
Albumin: 4.6 g/dL (ref 3.5–5.0)
Alkaline Phosphatase: 69 U/L (ref 38–126)
Anion gap: 16 — ABNORMAL HIGH (ref 5–15)
BUN: 34 mg/dL — ABNORMAL HIGH (ref 8–23)
CO2: 20 mmol/L — ABNORMAL LOW (ref 22–32)
Calcium: 11.4 mg/dL — ABNORMAL HIGH (ref 8.9–10.3)
Chloride: 106 mmol/L (ref 98–111)
Creatinine, Ser: 1.78 mg/dL — ABNORMAL HIGH (ref 0.44–1.00)
GFR, Estimated: 29 mL/min — ABNORMAL LOW (ref >60)
Glucose, Bld: 135 mg/dL — ABNORMAL HIGH (ref 70–99)
Potassium: 4.2 mmol/L (ref 3.5–5.1)
Sodium: 142 mmol/L (ref 135–145)
Total Bilirubin: 0.3 mg/dL (ref 0.0–1.2)
Total Protein: 7.9 g/dL (ref 6.5–8.1)

## 2024-01-01 LAB — LACTIC ACID, PLASMA
Lactic Acid, Venous: 1.9 mmol/L (ref 0.5–1.9)
Lactic Acid, Venous: 1.2 mmol/L (ref 0.5–1.9)

## 2024-01-01 LAB — URINALYSIS, W/ REFLEX TO CULTURE (INFECTION SUSPECTED)
Bilirubin Urine: NEGATIVE
Glucose, UA: NEGATIVE mg/dL
Ketones, ur: NEGATIVE mg/dL
Nitrite: NEGATIVE
Protein, ur: NEGATIVE mg/dL
RBC / HPF: >50 RBC/hpf (ref 0–5)
Specific Gravity, Urine: 1.014 (ref 1.005–1.030)
pH: 6.5 (ref 5.0–8.0)

## 2024-01-01 LAB — CBC WITH DIFFERENTIAL/PLATELET
Abs Immature Granulocytes: 0.04 10*3/uL (ref 0.00–0.07)
Basophils Absolute: 0.1 10*3/uL (ref 0.0–0.1)
Basophils Relative: 1 %
Eosinophils Absolute: 0.1 10*3/uL (ref 0.0–0.5)
Eosinophils Relative: 1 %
HCT: 40.2 % (ref 36.0–46.0)
Hemoglobin: 13.7 g/dL (ref 12.0–15.0)
Immature Granulocytes: 0 %
Lymphocytes Relative: 33 %
Lymphs Abs: 3.7 10*3/uL (ref 0.7–4.0)
MCH: 33.4 pg (ref 26.0–34.0)
MCHC: 34.1 g/dL (ref 30.0–36.0)
MCV: 98 fL (ref 80.0–100.0)
Monocytes Absolute: 1.2 10*3/uL — ABNORMAL HIGH (ref 0.1–1.0)
Monocytes Relative: 10 %
Neutro Abs: 6.2 10*3/uL (ref 1.7–7.7)
Neutrophils Relative %: 55 %
Platelets: 219 10*3/uL (ref 150–400)
RBC: 4.1 MIL/uL (ref 3.87–5.11)
RDW: 12.7 % (ref 11.5–15.5)
WBC: 11.3 10*3/uL — ABNORMAL HIGH (ref 4.0–10.5)
nRBC: 0 % (ref 0.0–0.2)

## 2024-01-01 LAB — LIPASE, BLOOD: Lipase: 29 U/L (ref 11–51)

## 2024-01-01 MED ORDER — ONDANSETRON 4 MG PO TBDP: 4.0000 mg | ORAL_TABLET | Freq: Three times a day (TID) | ORAL | 0 refills | Status: DC | PRN

## 2024-01-01 MED ORDER — OXYCODONE-ACETAMINOPHEN 5-325 MG PO TABS: 1.0000 | ORAL_TABLET | ORAL | 0 refills | Status: AC | PRN

## 2024-01-01 MED ORDER — ONDANSETRON HCL 4 MG/2ML IJ SOLN
4.0000 mg | Freq: Once | INTRAMUSCULAR | Status: AC
  Administered 2024-01-01: 4 mg via INTRAVENOUS
  Filled 2024-01-01: qty 2

## 2024-01-01 MED ORDER — MAGNESIUM SULFATE 2 GM/50ML IV SOLN
2.0000 g | Freq: Once | INTRAVENOUS | Status: AC
  Administered 2024-01-01: 2 g via INTRAVENOUS
  Filled 2024-01-01: qty 50

## 2024-01-01 MED ORDER — HYDROMORPHONE HCL 1 MG/ML IJ SOLN
1.0000 mg | Freq: Once | INTRAMUSCULAR | Status: AC
  Administered 2024-01-01: 1 mg via INTRAVENOUS
  Filled 2024-01-01: qty 1

## 2024-01-01 MED ORDER — ONDANSETRON 4 MG PO TBDP: 4.0000 mg | ORAL_TABLET | Freq: Three times a day (TID) | ORAL | 0 refills | Status: AC | PRN

## 2024-01-01 MED ORDER — OXYCODONE-ACETAMINOPHEN 5-325 MG PO TABS: 1.0000 | ORAL_TABLET | ORAL | 0 refills | Status: DC | PRN

## 2024-01-01 NOTE — ED Notes (Signed)
 Patient oxygen saturation dropped to mid 80s after administration of pain medicine. Patient placed on 2L of oxygen via [place]. Patient's oxygen saturation quickly recovered to 90's. Patient remains on monitor with alarms audible.

## 2024-01-01 NOTE — ED Notes (Signed)
 Oxygen removed from patient for room air trial.

## 2024-01-01 NOTE — ED Provider Notes (Signed)
 [place] EMERGENCY DEPARTMENT AT [facility] Provider Note   CSN: [id number] Arrival date & time: 01/01/24  9290     Patient presents with: Flank Pain   Robin Brady is a 78 y.o. female.   Pt is 78 year old female that came in with left flank pain that started this morning. Pain was preceded with discomfort around 3 am with increased pain around 5:30 am that caused her to come to the ED.  Pt has a PMH of Pancreatitis, DM, Kidney condition due to receiving contrast for CT, Post cholecystectomy years ago.   Pt denies any fever, SOB, CP, dizziness. She had dry heaves and threw up 2 times before coming in and it appeared to look like bile the first time and then food particles the second time. Pt endorses tugging sensation in the left groin region. She denies any burning sensation or pain during urination. She denies any blood in her urine. Pt did not take any pain medication during these episodes.    Flank Pain       Prior to Admission medications   Medication Sig Start Date End Date Taking? Authorizing Provider  Calcium  Carbonate-Vitamin D  600-400 MG-UNIT tablet Take 1 tablet by mouth daily.    [provider]  glucose blood (PRECISION QID TEST) test strip daily. 10/23/17   [provider]  metFORMIN  (GLUCOPHAGE -XR) 500 MG 24 hr tablet Take 500 mg by mouth daily with breakfast.    [provider]  metoprolol  succinate (TOPROL -XL) 25 MG 24 hr tablet Take 1 tablet (25 mg total) by mouth daily. 06/05/22   Emelia Josefa HERO, NP  metroNIDAZOLE  (METROGEL ) 0.75 % gel Apply 1 application topically daily. 04/20/16   [provider]  mometasone  (ELOCON ) 0.1 % cream Apply 1 application topically daily as needed (for rash).     [provider]  Multiple Vitamins-Minerals (MULTIVITAMIN PO) Take 1 tablet by mouth daily.    [provider]  Omega 3 1000 MG CAPS Take 3,000 mg by mouth 2 (two) times daily with a meal.     [provider]  polyethylene glycol (MIRALAX  / GLYCOLAX ) packet Take 17 g by mouth daily.    [provider]  rosuvastatin  (CRESTOR ) 5 MG tablet Take 5 mg by mouth every Monday, Wednesday, Friday, and Saturday at 6 PM. M/w/f/s    [provider]  vitamin B-12 (CYANOCOBALAMIN ) 1000 MCG tablet Take 1,000 mcg by mouth daily. M/w/f/s    [provider]    Allergies: Penicillins, Statins, Sulfa antibiotics, Tussionex pennkinetic er [hydrocod poli-chlorphe poli er], Latex, and Other    Review of Systems  Constitutional:        All pertinent ROS in the HPI  Genitourinary:  Positive for flank pain.    Updated Vital Signs BP (!) 196/88 (BP Location: Right Arm)   Pulse 60   Temp 98.9 F (37.2 C) (Temporal)   Resp 18   SpO2 99%   Physical Exam Constitutional:      General: She is in acute distress.     Appearance: She is ill-appearing.     Comments: Pt was mildly restless and in pain  HENT:     Head: Normocephalic.  Cardiovascular:     Rate and Rhythm: Normal rate and regular rhythm.     Heart sounds: Normal heart sounds.  Pulmonary:     Breath sounds: Normal breath sounds.  Abdominal:     Palpations: Abdomen is soft.     Tenderness:  There is no abdominal tenderness. There is no guarding or rebound.  Genitourinary:    Comments: -ve Lloyd's punch. No tenderness or rebound in the left flank on palpation. Pt endorses pain in left flank region.  Neurological:     General: No focal deficit present.     Mental Status: She is alert and oriented to person, place, and time.  Psychiatric:        Thought Content: Thought content normal.     (all labs ordered are listed, but only abnormal results are displayed) Labs Reviewed  CBC WITH DIFFERENTIAL/PLATELET  COMPREHENSIVE METABOLIC PANEL WITH GFR  LACTIC ACID, PLASMA  LACTIC ACID, PLASMA  URINALYSIS, W/ REFLEX TO CULTURE (INFECTION SUSPECTED)  LIPASE, BLOOD    EKG: None  Radiology: No results  found.   Procedures   Medications Ordered in the ED  HYDROmorphone  (DILAUDID ) injection 1 mg (has no administration in time range)  ondansetron  (ZOFRAN ) injection 4 mg (has no administration in time range)  magnesium  sulfate IVPB 2 g 50 mL (has no administration in time range)                                    Medical Decision Making Pt came in with left flank pain with physical exam findings pointing towards Kidney stones, UTI or possibly pancreatitis. Pt got labs and imaging done for her diagnoses. Her lipase levels were normal. Urinalysis was positive for leukocytes and rare bacteria. Pt kidney had 5 mm calculus in the mid left ureter with mild to moderate obstructive uropathy.  Pt received dilauded, magnesium , and zofran  for pain, nausea and kidney stone passing management. Pt had no pain after receiving medications, felt much better and was ready to go home. She mentioned she had an appt with her nephrologist tomorrow with whom she would further discuss her lab and imaging results like high levels of BUN and creatine. Pt was discharged with Zofran  and percosate for nausea and pain.    Amount and/or Complexity of Data Reviewed Labs: ordered. Radiology: ordered.  Risk Prescription drug management.        Final diagnoses:  None    ED Discharge Orders     None          Edgardo Pontiff, DO 01/01/24 1544    Tegeler, Lonni PARAS, MD 01/06/24 [phone]

## 2024-01-01 NOTE — Discharge Instructions (Addendum)
 We felt it was fine to d/c you as you denied any pain or unease at the end of your visit, with negative urine culture and other lab results. We believe your kidney stone might have passed during the visit which alleviated your pain. Please visit your PCP or urologist for further work up on the 5 mm kidney stone in your mid left ureter. Take Percosate for pain, as needed Take Zofran  for nausea, as needed. Please return to the nearest ED for uncontrollable pain, fever, or other potential complications in the future.

## 2024-01-01 NOTE — Telephone Encounter (Signed)
 There was an issue with the prescription so I resent it to the pharmacy for her.

## 2024-01-01 NOTE — ED Triage Notes (Signed)
 Patient states left sided flank pain that began this morning. Several episodes of vomiting since.

## 2024-01-08 NOTE — Telephone Encounter (Signed)
 Sent in but if she worsens she needs to go back to er

## 2024-01-08 NOTE — Telephone Encounter (Signed)
 Copied from CRM #[id number]. Topic: Medication/Rx - Medication Request - Symptoms >> Jan 08, 2024  5:03 PM Lauraine DASEN wrote: Junette Millman E is calling for clinical concerns (Ask: What symptoms are you calling about today, AND how long have you had these symptoms? Must Review HPKW list for symptoms) Document Name of Triage Nurse/BH Rep taking the call when applicable)    Include all details related to the request(s) below:  Junette Millman E called requesting medication and is having symptoms. Declined appointment.  Message sent to appropriate Triage Pool and advised of 4 hour SLA.  Patient advises she has history of kidney stones-Diagnosed 01/01/24 at [facility] on [address].  Another attack on 01/06/24.  Patient thought she had passed it however now realizes it is likely in her Ureter.  She is drinking 64oz of water a day to try to get it to pass.  Patient spoke with [facility] who advised her to go to emergency room. Patient advises that is not necessary.  Patient would like to know if medication can be sent to pharmacy-Oxycodone (3-4 pills for when the attack comes). Is provider willing to send medication?  Patient request return call.  Patient advises she is not currently in pain however knows it is coming.  Patient is still urinating.   [id number] - [place], [place] - [address] - PHONE: [phone] - FAX: [phone]  [address] [place] [address]  Phone: [phone] <redacted file path> Fax: [phone]     Contact: Shuntell, Foody (Self) [phone]

## 2024-01-08 NOTE — Telephone Encounter (Signed)
 Patient is aware that pain medication has been sent to the pharmacy and if she worsens in any way, she must go to ED per Monica Aho, PA-C and patient states understanding.

## 2024-01-28 ENCOUNTER — Ambulatory Visit: Admitting: Cardiovascular Disease

## 2024-01-28 ENCOUNTER — Encounter: Payer: Self-pay | Admitting: Cardiovascular Disease

## 2024-01-28 ENCOUNTER — Ambulatory Visit
Admission: RE | Admit: 2024-01-28 | Discharge: 2024-01-28 | Disposition: A | Discharge status: Home / Self Care | Source: Ambulatory Visit | Attending: Cardiovascular Disease | Admitting: Cardiovascular Disease

## 2024-01-28 ENCOUNTER — Ambulatory Visit: Payer: Self-pay | Admitting: Cardiovascular Disease

## 2024-01-28 VITALS — BP 104/62 | HR 63 | Ht 63.0 in | Wt 151.0 lb

## 2024-01-28 DIAGNOSIS — E119 Type 2 diabetes mellitus without complications: Secondary | ICD-10-CM | POA: Diagnosis not present

## 2024-01-28 DIAGNOSIS — Z87442 Personal history of urinary calculi: Secondary | ICD-10-CM | POA: Diagnosis not present

## 2024-01-28 DIAGNOSIS — E782 Mixed hyperlipidemia: Secondary | ICD-10-CM | POA: Diagnosis present

## 2024-01-28 DIAGNOSIS — I10 Essential (primary) hypertension: Secondary | ICD-10-CM | POA: Diagnosis not present

## 2024-01-28 DIAGNOSIS — Z7984 Long term (current) use of oral hypoglycemic drugs: Secondary | ICD-10-CM | POA: Insufficient documentation

## 2024-01-28 DIAGNOSIS — I2510 Atherosclerotic heart disease of native coronary artery without angina pectoris: Secondary | ICD-10-CM | POA: Diagnosis not present

## 2024-01-28 DIAGNOSIS — I700 Atherosclerosis of aorta: Secondary | ICD-10-CM | POA: Diagnosis not present

## 2024-01-28 DIAGNOSIS — F17210 Nicotine dependence, cigarettes, uncomplicated: Secondary | ICD-10-CM | POA: Insufficient documentation

## 2024-01-28 DIAGNOSIS — R931 Abnormal findings on diagnostic imaging of heart and coronary circulation: Secondary | ICD-10-CM
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
## 2024-01-28 NOTE — Assessment & Plan Note (Signed)
 History of essential hypertension with blood pressure measured today at 104/62.  She is on metoprolol .

## 2024-01-28 NOTE — Assessment & Plan Note (Signed)
 History of hyperlipidemia on rosuvastatin  5 mg 4 days a week.  Her most recent lipid profile performed 09/04/2023 revealed total cholesterol 211, LDL of 131 and HDL 55.  I am going to check a coronary calcium  score to risk stratify.

## 2024-01-28 NOTE — Patient Instructions (Addendum)
 Medication Instructions:  Your physician recommends that you continue on your current medications as directed. Please refer to the Current Medication list given to you today.  *If you need a refill on your cardiac medications before your next appointment, please call your pharmacy*   Testing/Procedures: Dr. Court has ordered a CT coronary calcium  score.   Test locations:  [facility] at [facility]  [facility] at [facility]  This is $99 out of pocket.   Coronary CalciumScan A coronary calcium  scan is an imaging test used to look for deposits of calcium  and other fatty materials (plaques) in the inner lining of the blood vessels of the heart (coronary arteries). These deposits of calcium  and plaques can partly clog and narrow the coronary arteries without producing any symptoms or warning signs. This puts a person at risk for a heart attack. This test can detect these deposits before symptoms develop. Tell a health care provider about: Any allergies you have. All medicines you are taking, including vitamins, herbs, eye drops, creams, and over-the-counter medicines. Any problems you or family members have had with anesthetic medicines. Any blood disorders you have. Any surgeries you have had. Any medical conditions you have. Whether you are pregnant or may be pregnant. What are the risks? Generally, this is a safe procedure. However, problems may occur, including: Harm to a pregnant woman and her unborn baby. This test involves the use of radiation. Radiation exposure can be dangerous to a pregnant woman and her unborn baby. If you are pregnant, you generally should not have this procedure done. Slight increase in the risk of cancer. This is because of the radiation involved in the test. What happens before the procedure? No preparation is needed for this procedure. What happens  during the procedure? You will undress and remove any jewelry around your neck or chest. You will put on a hospital gown. Sticky electrodes will be placed on your chest. The electrodes will be connected to an electrocardiogram (ECG) machine to record a tracing of the electrical activity of your heart. A CT scanner will take pictures of your heart. During this time, you will be asked to lie still and hold your breath for 2-3 seconds while a picture of your heart is being taken. The procedure may vary among health care providers and hospitals. What happens after the procedure? You can get dressed. You can return to your normal activities. It is up to you to get the results of your test. Ask your health care provider, or the department that is doing the test, when your results will be ready. Summary A coronary calcium  scan is an imaging test used to look for deposits of calcium  and other fatty materials (plaques) in the inner lining of the blood vessels of the heart (coronary arteries). Generally, this is a safe procedure. Tell your health care provider if you are pregnant or may be pregnant. No preparation is needed for this procedure. A CT scanner will take pictures of your heart. You can return to your normal activities after the scan is done. This information is not intended to replace advice given to you by your health care provider. Make sure you discuss any questions you have with your health care provider. Document Released: 12/15/2007 Document Revised: 05/07/2016 Document Reviewed: 05/07/2016 Elsevier Interactive Patient Education  2017 [employer].   Follow-Up: At [facility], you and your health needs are our priority.  As part of our continuing mission to provide you with exceptional heart care, our providers are all part of one team.  This team includes your primary Cardiologist (physician) and Advanced Practice Providers or APPs (Physician Assistants and Nurse  Practitioners) who all work together to provide you with the care you need, when you need it.  Your next appointment:   12 month(s)  Provider:   Dorn Lesches, MD    We recommend signing up for the patient portal called MyChart.  Sign up information is provided on this After Visit Summary.  MyChart is used to connect with patients for Virtual Visits (Telemedicine).  Patients are able to view lab/test results, encounter notes, upcoming appointments, etc.  Non-urgent messages can be sent to your provider as well.   To learn more about what you can do with MyChart, go to [email or web address].

## 2024-02-04 NOTE — Progress Notes (Unsigned)
 Patient ID: Robin Brady                 DOB: 1945-07-27                    MRN: [id number]      HPI: Robin Brady is a 78 y.o. female patient referred to lipid clinic by [facility]. PMH is significant for hypertension, T2DM,  family history of heart disease.HLD, CAD - CAC score of 106   Patient presented today for lipid clinic.She tolerates only a low dose of Crestor  (rosuvastatin ) taken 4 times per week. Her diet is generally good; she and her husband primarily eat home-cooked meals and rarely eat out. She enjoys yard work and stays busy with household activities but does not follow a formal exercise regimen.  The patient previously tried Zetia  (ezetimibe ), but it was insufficient to lower her LDL cholesterol, so it was discontinued. She expressed concerns about over-the-counter fish oil supplements due to potential heavy metal exposure and is interested in trying Vascepa (icosapent ethyl). Her triglycerides were elevated nine years ago, and she has a history of pancreatitis on three occasions. Given her type 2 diabetes mellitus (T2DM), Vascepa is indicated, and coverage will be assessed.  Discussed various options for lowering LDL cholesterol, including ezetimibe , PCSK9 inhibitors, bempedoic acid, and inclisiran. The mechanisms of action, dosing schedules, side effects, and expected LDL cholesterol reductions were reviewed in detail. Also discussed cost considerations and potential patient assistance programs.   Current Medications: rosuvastatin  5 mg 4 days a week  Intolerances: Crestor  5 mg daily dose  Risk Factors: hypertension, T2DM,  family history of premature ASCVD ( father MI at age of 10) ,CAD - CAC score of 106  LDL goal: <70 or <55 mg/dl  Last lab 96/7974 TC 788, TG 135, HDL 55, LDL 131   Diet: heart healthy   Exercise: none,stays active around the house   Family History:  Relation Problem Comments  Mother (Deceased at age 33) Multiple myeloma     Father (Deceased at age 59)  Diabetes   Heart attack (Age: 45)   Heart failure     Sister Metallurgist)   Brother Metallurgist) Diabetes (Age: 47)   Hypertension (Age: 75)     Brother (Armed forces training and education officer) Crohn's disease (Age: 64)     Brother (Alive) Alcoholism   Diabetes (Age: 58)   Ulcers Foot ulcers - developed MRSA following, 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      Labs:  Lipid Panel     Component Value Date/Time   CHOL 149 10/10/2020 0421   TRIG 117 10/10/2020 0421   HDL 48 10/10/2020 0421   CHOLHDL 3.1 10/10/2020 0421   VLDL 23 10/10/2020 0421   LDLCALC 78 10/10/2020 0421    Past Medical History:  Diagnosis Date   Anemia    Arthritis    OA   Constipation    Diabetes (HCC)    Diabetes mellitus without complication (HCC)    Dysrhythmia    HX PALPITATIONS    Hyperlipemia    Hypertension    Loss of consciousness (HCC)    Pancreatitis    Ptosis    Thyroid  nodule    8-10 YRS  NO CHANGE    Current Outpatient Medications on File Prior to Visit  Medication Sig Dispense Refill   budesonide-glycopyrrolate-formoterol (BREZTRI AEROSPHERE) 160-9-4.8 MCG/ACT AERO inhaler Inhale 2 puffs into the lungs as needed.     glucose blood (PRECISION QID TEST) test strip daily.  metFORMIN  (GLUCOPHAGE -XR) 500 MG 24 hr tablet Take 500 mg by mouth daily with breakfast.     metoprolol  succinate (TOPROL -XL) 25 MG 24 hr tablet Take 1 tablet (25 mg total) by mouth daily. 90 tablet 3   metroNIDAZOLE  (METROGEL ) 0.75 % gel Apply 1 application topically daily.  2   mometasone  (ELOCON ) 0.1 % cream Apply 1 application topically daily as needed (for rash).      Multiple Vitamins-Minerals (MULTIVITAMIN PO) Take 1 tablet by mouth daily. (Patient not taking: Reported on 01/28/2024)     Omega 3 1000 MG CAPS Take 3,000 mg by mouth 2 (two) times daily with a meal.  (Patient not taking: Reported on 01/28/2024)     ondansetron  (ZOFRAN -ODT) 4 MG disintegrating tablet Take 1 tablet (4 mg total) by mouth every 8 (eight) hours as needed for nausea or vomiting.  15 tablet 0   oxyCODONE -acetaminophen  (PERCOCET/ROXICET) 5-325 MG tablet Take 1 tablet by mouth every 4 (four) hours as needed for severe pain (pain score 7-10). 10 tablet 0   polyethylene glycol (MIRALAX  / GLYCOLAX ) packet Take 17 g by mouth daily.     rosuvastatin  (CRESTOR ) 5 MG tablet Take 5 mg by mouth every Monday, Wednesday, Friday, and Saturday at 6 PM. M/w/f/s     vitamin B-12 (CYANOCOBALAMIN ) 1000 MCG tablet Take 1,000 mcg by mouth daily. M/w/f/s (Patient not taking: Reported on 01/28/2024)     No current facility-administered medications on file prior to visit.    Allergies  Allergen Reactions   Penicillins Hives and Other (See Comments)    As a child. Tolerates cephalosporins. Has patient had a PCN reaction causing immediate rash, facial/tongue/throat swelling, SOB or lightheadedness with hypotension: No Has patient had a PCN reaction causing severe rash involving mucus membranes or skin necrosis: No Has patient had a PCN reaction that required hospitalization No Has patient had a PCN reaction occurring within the last 10 years: No If all of the above answers are NO, then may proceed with Cephalosporin use.   Statins Other (See Comments)    Muscle cramps   Sulfa Antibiotics Swelling and Other (See Comments)    angioedema   Tussionex Pennkinetic Er [Hydrocod Poli-Chlorphe Poli Er] Other (See Comments)    thought she was going to die, Cold sweat, Bradycardia, Tolerates hydrocodone    Latex Swelling, Rash and Other (See Comments)    Local swelling   Other Other (See Comments)    Assessment/Plan:  1. Hyperlipidemia -  Problem  Mixed Diabetic Hyperlipidemia Associated With Type 2 Diabetes Mellitus (Hcc)   Current Medications: rosuvastatin  5 mg 4 days a week  Intolerances: Crestor  5 mg daily dose  Risk Factors: hypertension,T2DM,  family history of premature ASCVD ( father MI at age of 66) ,CAD - CAC score of 106  LDL goal: <70 or <55 mg/dl  Last lab 96/7974 TC 788, TG  135, HDL 55, LDL 131     Mixed diabetic hyperlipidemia associated with type 2 diabetes mellitus (HCC) Assessment:  LDL goal: < 70 mg/dl or <44 mg/dl  last LDLc 868 mg/dl (96/7974)  Max tolerated satin dose is Crestor  5 mg 4 times per week  Discussed next potential options (Zetia , PCSK-9 inhibitors, bempedoic acid and inclisiran); cost, dosing efficacy, side effects  Follows heart healthy diet and stays active around the house but does not follow exercise regimen  Her triglycerides were elevated nine years ago, and she has a history of pancreatitis on three occasions. Given her type 2 diabetes mellitus (T2DM), Vascepa is  indicated, and coverage will be assessed.   Plan: Continue taking current medications (Crestor  5 mg 4 times per week ) Will apply for PA for PCSK9i and Vascepa ; will inform patient upon approval  Lipid lab due in 2-3 months after starting PCSK9i  Thank you,  Robbi Blanch, Pharm.D [facility]. [facility] [address], [place], [place] [address] Phone: [phone]; Fax: [phone]

## 2024-02-05 ENCOUNTER — Ambulatory Visit: Attending: Cardiology | Admitting: Pharmacist

## 2024-02-05 ENCOUNTER — Other Ambulatory Visit (HOSPITAL_COMMUNITY): Payer: Self-pay

## 2024-02-05 ENCOUNTER — Telehealth: Payer: Self-pay | Admitting: Pharmacist

## 2024-02-05 ENCOUNTER — Telehealth: Payer: Self-pay | Admitting: Pharmacy Technician

## 2024-02-05 ENCOUNTER — Encounter: Payer: Self-pay | Admitting: Pharmacist

## 2024-02-05 DIAGNOSIS — E782 Mixed hyperlipidemia: Secondary | ICD-10-CM

## 2024-02-05 DIAGNOSIS — E1169 Type 2 diabetes mellitus with other specified complication: Secondary | ICD-10-CM

## 2024-02-05 DIAGNOSIS — E7849 Other hyperlipidemia: Secondary | ICD-10-CM

## 2024-02-05 MED ORDER — EZETIMIBE 10 MG PO TABS: 10.0000 mg | ORAL_TABLET | Freq: Every day | ORAL | 3 refills | Status: AC

## 2024-02-05 NOTE — Telephone Encounter (Signed)
   Generic rejected saying insurance wants brand  Vascepa came back 345.37 for 30 days but saying consider formulary generic. Patient has deductible

## 2024-02-05 NOTE — Telephone Encounter (Signed)
 Pharmacy Patient Advocate Encounter  Received notification from [facility] that Prior Authorization for Repatha has been APPROVED from 02/05/24 to 08/07/24. Ran test claim, Copay is $462.66- one month. This test claim was processed through [facility]- copay amounts may vary at other pharmacies due to pharmacy/plan contracts, or as the patient moves through the different stages of their insurance plan.   PA #/Case ID/Reference #: [id number]

## 2024-02-05 NOTE — Assessment & Plan Note (Addendum)
 Assessment:  LDL goal: < 70 mg/dl or <44 mg/dl  last LDLc 868 mg/dl (96/7974)  Max tolerated satin dose is Crestor  5 mg 4 times per week  Discussed next potential options (Zetia , PCSK-9 inhibitors, bempedoic acid and inclisiran); cost, dosing efficacy, side effects  Follows heart healthy diet and stays active around the house but does not follow exercise regimen  Her triglycerides were elevated nine years ago, and she has a history of pancreatitis on three occasions. Given her type 2 diabetes mellitus (T2DM), Vascepa is indicated, and coverage will be assessed.   Plan: Continue taking current medications (Crestor  5 mg 4 times per week ) Will apply for PA for PCSK9i and Vascepa ; will inform patient upon approval  Lipid lab due in 2-3 months after starting PCSK9i

## 2024-02-05 NOTE — Patient Instructions (Addendum)
 Your Results:             Your most recent labs Goal  Total Cholesterol 211 < 200  Triglycerides 135 < 150  HDL (happy/good cholesterol) 55 > 40  LDL (lousy/bad cholesterol 131 < 55   Medication changes: start taking Zetia  10 mg daily and continue taking Crestor  5 mg 4 times per week. We will start the process to get Repatha covered by your insurance.  Once the prior authorization is complete, we will call you to let you know and confirm pharmacy information.        Repatha is a cholesterol medication that improved your body's ability to get rid of bad cholesterol known as LDL. It can lower your LDL up to 60%! It is an injection that is given under the skin every 2 weeks. The medication often requires a prior authorization from your insurance company.  The most common side effects of Repatha include runny nose, symptoms of the common cold, rarely flu or flu-like symptoms, back/muscle pain in about 3-4% of the patients, and redness, pain, or bruising at the injection site.   Lab orders: We want to repeat labs after 2-3 months.

## 2024-02-05 NOTE — Telephone Encounter (Signed)
 Pharmacy Patient Advocate Encounter   Received notification from Pt Calls Messages that prior authorization for Repatha is required/requested.   Insurance verification completed.   The patient is insured through [facility] .   Per test claim: PA required; PA submitted to above mentioned insurance via CoverMyMeds Key/confirmation #/EOC [id number] Status is pending

## 2024-02-06 LAB — LIPOPROTEIN A (LPA): Lipoprotein (a): 21.5 nmol/L (ref <75.0)

## 2024-02-06 NOTE — Telephone Encounter (Signed)
 Patient made aware of PA approval. Co-pay is high.  Will discuss last year income with her husband and will get back to us  to see if they qualify for the grant.

## 2024-02-10 ENCOUNTER — Encounter: Payer: Self-pay | Admitting: Cardiovascular Disease

## 2024-02-11 ENCOUNTER — Telehealth: Payer: Self-pay | Admitting: Pharmacy Technician

## 2024-02-11 ENCOUNTER — Other Ambulatory Visit (HOSPITAL_COMMUNITY): Payer: Self-pay

## 2024-02-11 NOTE — Telephone Encounter (Signed)
 Patient Advocate Encounter   The patient was approved for a [employer] grant that will help cover the cost of Repatha  Total amount awarded, 2500.00.  Effective: 01/12/24 - 01/10/25   APW:[id number] ERW:[id number] Hmnle:[id number] PI:[id number]  [employer] ID: [id number]   Pharmacy provided with approval and processing information. Patient informed via mychart

## 2024-02-13 MED ORDER — REPATHA SURECLICK 140 MG/ML ~~LOC~~ SOAJ: 140.0000 mg | SUBCUTANEOUS | 3 refills | Status: AC

## 2024-02-13 NOTE — Addendum Note (Signed)
 Addended by: Eliette Drumwright K on: 02/13/2024 10:56 AM   Modules accepted: Orders

## 2024-02-28 NOTE — Telephone Encounter (Signed)
 PA for Repatha  approved and pt enrolled in [facility] grant

## 2024-02-28 NOTE — Telephone Encounter (Signed)
See other encounter for more info.

## 2024-04-25 LAB — LIPID PANEL
Chol/HDL Ratio: 1.4 ratio (ref 0.0–4.4)
Cholesterol, Total: 93 mg/dL — ABNORMAL LOW (ref 100–199)
HDL: 67 mg/dL (ref >39)
LDL Chol Calc (NIH): 11 mg/dL (ref 0–99)
Triglycerides: 71 mg/dL (ref 0–149)
VLDL Cholesterol Cal: 15 mg/dL (ref 5–40)

## 2024-04-25 LAB — HEPATIC FUNCTION PANEL
ALT: 11 IU/L (ref 0–32)
AST: 23 IU/L (ref 0–40)
Albumin: 4.3 g/dL (ref 3.8–4.8)
Alkaline Phosphatase: 70 IU/L (ref 49–135)
Bilirubin Total: 0.3 mg/dL (ref 0.0–1.2)
Bilirubin, Direct: 0.13 mg/dL (ref 0.00–0.40)
Total Protein: 6.4 g/dL (ref 6.0–8.5)

## 2024-04-27 ENCOUNTER — Ambulatory Visit: Payer: Self-pay | Admitting: Pharmacist
